# Patient Record
Sex: Male | Born: 1965 | Hispanic: No | Marital: Married | Smoking: Never smoker
Health system: Western US, Academic
[De-identification: ages and names within clinical notes are randomized; demographics above are authoritative.]

## PROBLEM LIST (undated history)

## (undated) HISTORY — PX: KNEE SURGERY: SHX244

---

## 1988-02-08 HISTORY — PX: PR ARTHROTOMY W/MENISCUS REPAIR KNEE: 27403

## 1988-02-08 HISTORY — PX: PR ARTHROTOMY,OPEN REPAIR MENISCUS: 27403

## 2013-02-07 HISTORY — PX: PR RELEASE THENAR MUSCLE: 26508

## 2013-02-07 HISTORY — PX: PR RELEASE THUMB CONTRACTURE: 26508

## 2014-03-10 HISTORY — PX: OTHER SURGICAL HISTORY: SHX169

## 2016-06-22 NOTE — Progress Notes (Signed)
Cardiology Office Note  Date:  06/24/2016   ID:  Joseph Bradley, DOB 06/12/1965, MRN [id number]  PCP:  Patient, No Pcp Per   Chief Complaint  Patient presents with  . other    self referral. Patient c/o SOB. Patient stated heart "doesnt feel right". Meds reviewed verbally with patient.     HPI:  51 year old male with strong family history of coronary artery disease, borderline hypertension, who presents by self-referral for cardiac evaluation of chest pain  He reports having rare episodes of chest discomfort, typically presenting at rest No symptoms with exertion Very active at baseline, [place], active with his horses   Reports having previous episode of chest pain several years ago  CT coronary calcium score for chest tightness, Reports that his score was 0  Done in [facility] , 2 to 3 years  Blood pressure mildly elevated on today's visit that he is in a very stressful job, 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  PSH:    Past Surgical History:  Procedure Laterality Date  . KNEE SURGERY      Current Outpatient Prescriptions  Medication Sig Dispense Refill  . ibuprofen (ADVIL,MOTRIN) 100 MG chewable tablet Chew by mouth daily as needed.      No current facility-administered medications for this visit.      Allergies:   Patient has no allergy information on record.   Social History:  The patient  reports that he has never smoked. His smokeless tobacco use includes Chew. He reports that he drinks alcohol. He reports that he does not use drugs.   Family History:   family history is not on file.    Review of Systems: Review of Systems  Constitutional:  Negative.   Respiratory: Negative.   Cardiovascular: Negative.   Gastrointestinal: Negative.   Musculoskeletal: Negative.   Neurological: Negative.   Psychiatric/Behavioral: Negative.   All other systems reviewed and are negative.    PHYSICAL EXAM: VS:  BP (!) 138/92 (BP Location: Right Arm, Patient Position: Sitting, Cuff Size: Normal)   Pulse 91   Ht 5\' 11"  (1.803 m)   Wt 248 lb 12 oz (112.8 kg)   BMI 34.69 kg/m  , BMI Body mass index is 34.69 kg/m. GEN: Well nourished, well developed, in no acute distress  HEENT: normal  Neck: no JVD, carotid bruits, or masses Cardiac: RRR; no murmurs, rubs, or gallops,no edema  Respiratory:  clear to auscultation bilaterally, normal work of breathing GI: soft, nontender, nondistended, + BS MS: no deformity or atrophy  Skin: warm and dry, no rash Neuro:  Strength and sensation are intact Psych: euthymic mood, full affect    Recent Labs: No results found for requested labs within last 8760 hours.    Lipid Panel No results found for: CHOL, HDL, LDLCALC, TRIG    Wt Readings from Last 3 Encounters:  06/24/16 248 lb 12 oz (112.8 kg)       ASSESSMENT AND PLAN:  Chest pain, unspecified type - Plan: EKG 12-Lead, Hepatic function panel, Lipid Profile, Hemoglobin A1c, Basic Metabolic Panel (BMET) We have requested his CT coronary calcium score from [place] done 2 or 3 years ago Atypical  symptoms, likely will not need any further workup Lab work ordered including lipid panel  Encounter for preventive care Nonsmoker, no diabetes Cholesterol panel pending Active at baseline He does not have primary care. Recommended he try to schedule visit with local physician  Borderline blood pressure Long discussion with him concerning his blood pressure Suggested he apply a blood pressure cuff As a nondiabetic, goal systolic pressure less than 140 diastolic less than 90 Suggested he call our office if blood pressure runs routinely above  this goal Recommended weight loss, low salt diet  Disposition:   F/U  as needed   Total encounter time more than 45 minutes  Greater than 50% was spent in counseling and coordination of care with the patient    Orders Placed This Encounter  Procedures  . Hepatic function panel  . Lipid Profile  . Hemoglobin A1c  . Basic Metabolic Panel (BMET)  . EKG 12-Lead     Signed, Dossie Arbourim Fraya Ueda, M.D., Ph.D. 06/24/2016  [facility], [place] [phone]

## 2016-06-24 ENCOUNTER — Encounter: Payer: Self-pay | Admitting: Cardiovascular Disease

## 2016-06-24 ENCOUNTER — Ambulatory Visit (INDEPENDENT_AMBULATORY_CARE_PROVIDER_SITE_OTHER): Payer: Commercial Managed Care - PPO | Admitting: Cardiovascular Disease

## 2016-06-24 VITALS — BP 138/92 | HR 91 | Ht 71.0 in | Wt 248.8 lb

## 2016-06-24 DIAGNOSIS — R030 Elevated blood-pressure reading, without diagnosis of hypertension: Secondary | ICD-10-CM | POA: Insufficient documentation

## 2016-06-24 DIAGNOSIS — Z0000 Encounter for general adult medical examination without abnormal findings: Secondary | ICD-10-CM

## 2016-06-24 DIAGNOSIS — R079 Chest pain, unspecified: Secondary | ICD-10-CM | POA: Insufficient documentation

## 2016-06-24 NOTE — Patient Instructions (Addendum)
We will [facility] records (in particular CT scan chest ) [facility]  Medication Instructions:   No medication changes made  Labwork:  We will check liver, lipids, HBA1C, BMP First week of June, fasting  Testing/Procedures:  No further testing at this time   I recommend watching educational videos on topics of interest to you at:       [email or web address]  Enter code: HEARTCARE    Follow-Up: It was a pleasure seeing you in the office today. Please call us if you have new issues that need to be addressed before your next appt.  [phone]  Your physician wants you to follow-up in: as needed  If you need a refill on your cardiac medications before your next appointment, please call your pharmacy.

## 2016-07-12 ENCOUNTER — Other Ambulatory Visit (INDEPENDENT_AMBULATORY_CARE_PROVIDER_SITE_OTHER): Payer: Commercial Managed Care - PPO

## 2016-07-12 DIAGNOSIS — Z0000 Encounter for general adult medical examination without abnormal findings: Secondary | ICD-10-CM

## 2016-07-12 DIAGNOSIS — R030 Elevated blood-pressure reading, without diagnosis of hypertension: Secondary | ICD-10-CM

## 2016-07-12 DIAGNOSIS — R079 Chest pain, unspecified: Secondary | ICD-10-CM

## 2016-07-13 LAB — SPECIMEN STATUS

## 2016-07-13 LAB — HEPATIC FUNCTION PANEL
ALK PHOS: 56 IU/L (ref 39–117)
ALT: 20 IU/L (ref 0–44)
AST: 21 IU/L (ref 0–40)
Albumin: 4.3 g/dL (ref 3.5–5.5)
Bilirubin Total: 0.3 mg/dL (ref 0.0–1.2)
Bilirubin, Direct: 0.08 mg/dL (ref 0.00–0.40)
Total Protein: 6.7 g/dL (ref 6.0–8.5)

## 2016-07-13 LAB — BASIC METABOLIC PANEL
BUN / CREAT RATIO: 17 (ref 9–20)
BUN: 14 mg/dL (ref 6–24)
CALCIUM: 8.9 mg/dL (ref 8.7–10.2)
CO2: 21 mmol/L (ref 18–29)
CREATININE: 0.82 mg/dL (ref 0.76–1.27)
Chloride: 106 mmol/L (ref 96–106)
GFR, EST AFRICAN AMERICAN: 117 mL/min/{1.73_m2} (ref >59)
GFR, EST NON AFRICAN AMERICAN: 101 mL/min/{1.73_m2} (ref >59)
Glucose: 82 mg/dL (ref 65–99)
Potassium: 4.5 mmol/L (ref 3.5–5.2)
Sodium: 143 mmol/L (ref 134–144)

## 2016-07-13 LAB — LIPID PANEL
CHOLESTEROL TOTAL: 153 mg/dL (ref 100–199)
Chol/HDL Ratio: 4.5 ratio (ref 0.0–5.0)
HDL: 34 mg/dL — AB (ref >39)
LDL Calculated: 91 mg/dL (ref 0–99)
TRIGLYCERIDES: 141 mg/dL (ref 0–149)
VLDL Cholesterol Cal: 28 mg/dL (ref 5–40)

## 2016-07-13 LAB — HEMOGLOBIN A1C
Est. average glucose Bld gHb Est-mCnc: 100 mg/dL
Hgb A1c MFr Bld: 5.1 % (ref 4.8–5.6)

## 2017-06-23 ENCOUNTER — Encounter: Payer: Self-pay | Admitting: Nurse Practitioner

## 2017-06-23 ENCOUNTER — Ambulatory Visit (INDEPENDENT_AMBULATORY_CARE_PROVIDER_SITE_OTHER): Payer: Commercial Managed Care - PPO | Admitting: Nurse Practitioner

## 2017-06-23 ENCOUNTER — Ambulatory Visit (INDEPENDENT_AMBULATORY_CARE_PROVIDER_SITE_OTHER): Payer: Commercial Managed Care - PPO

## 2017-06-23 VITALS — BP 146/90 | HR 97 | Temp 98.3°F | Ht 71.0 in | Wt 256.0 lb

## 2017-06-23 DIAGNOSIS — G8929 Other chronic pain: Secondary | ICD-10-CM

## 2017-06-23 DIAGNOSIS — R202 Paresthesia of skin: Secondary | ICD-10-CM

## 2017-06-23 DIAGNOSIS — Z1211 Encounter for screening for malignant neoplasm of colon: Secondary | ICD-10-CM | POA: Diagnosis not present

## 2017-06-23 DIAGNOSIS — M25511 Pain in right shoulder: Secondary | ICD-10-CM

## 2017-06-23 DIAGNOSIS — Z716 Tobacco abuse counseling: Secondary | ICD-10-CM | POA: Diagnosis not present

## 2017-06-23 DIAGNOSIS — Z720 Tobacco use: Secondary | ICD-10-CM

## 2017-06-23 DIAGNOSIS — R200 Anesthesia of skin: Secondary | ICD-10-CM

## 2017-06-23 DIAGNOSIS — M546 Pain in thoracic spine: Secondary | ICD-10-CM

## 2017-06-23 DIAGNOSIS — M47812 Spondylosis without myelopathy or radiculopathy, cervical region: Secondary | ICD-10-CM | POA: Diagnosis not present

## 2017-06-23 MED ORDER — BUPROPION HCL ER (SR) 150 MG PO TB12: 150.0000 mg | ORAL_TABLET | Freq: Two times a day (BID) | ORAL | 2 refills | Status: DC

## 2017-06-23 NOTE — Patient Instructions (Addendum)
Order cologuard. You will be contacted to verify information before kit is mailed.  Pick quit date, start wellbutrin 1week prior to quit date.  You will be contacted to schedule appt with ortho.  Bupropion sustained-release tablets (smoking cessation) What is this medicine? BUPROPION (byoo PROE pee on) is used to help people quit smoking. This medicine may be used for other purposes; ask your health care provider or pharmacist if you have questions. COMMON BRAND NAME(S): Buproban, Zyban What should I tell my health care provider before I take this medicine? They need to know if you have any of these conditions: -an eating disorder, such as anorexia or bulimia -bipolar disorder or psychosis -diabetes or high blood sugar, treated with medication -glaucoma -head injury or brain tumor -heart disease, previous heart attack, or irregular heart beat -high blood pressure -kidney or liver disease -seizures -suicidal thoughts or a previous suicide attempt -Tourette's syndrome -weight loss -an unusual or allergic reaction to bupropion, other medicines, foods, dyes, or preservatives -breast-feeding -pregnant or trying to become pregnant How should I use this medicine? Take this medicine by mouth with a glass of water. Follow the directions on the prescription label. You can take it with or without food. If it upsets your stomach, take it with food. Do not cut, crush or chew this medicine. Take your medicine at regular intervals. If you take this medicine more than once a day, take your second dose at least 8 hours after you take your first dose. To limit difficulty in sleeping, avoid taking this medicine at bedtime. Do not take your medicine more often than directed. Do not stop taking this medicine suddenly except upon the advice of your doctor. Stopping this medicine too quickly may cause serious side effects. A special MedGuide will be given to you by the pharmacist with each prescription and  refill. Be sure to read this information carefully each time. Talk to your pediatrician regarding the use of this medicine in children. Special care may be needed. Overdosage: If you think you have taken too much of this medicine contact a poison control center or emergency room at once. NOTE: This medicine is only for you. Do not share this medicine with others. What if I miss a dose? If you miss a dose, skip the missed dose and take your next tablet at the regular time. There should be at least 8 hours between doses. Do not take double or extra doses. What may interact with this medicine? Do not take this medicine with any of the following medications: -linezolid -MAOIs like Azilect, Carbex, Eldepryl, Marplan, Nardil, and Parnate -methylene blue (injected into a vein) -other medicines that contain bupropion like Wellbutrin This medicine may also interact with the following medications: -alcohol -certain medicines for anxiety or sleep -certain medicines for blood pressure like metoprolol, propranolol -certain medicines for depression or psychotic disturbances -certain medicines for HIV or AIDS like efavirenz, lopinavir, nelfinavir, ritonavir -certain medicines for irregular heart beat like propafenone, flecainide -certain medicines for Parkinson's disease like amantadine, levodopa -certain medicines for seizures like carbamazepine, phenytoin, phenobarbital -cimetidine -clopidogrel -cyclophosphamide -digoxin -furazolidone -isoniazid -nicotine -orphenadrine -procarbazine -steroid medicines like prednisone or cortisone -stimulant medicines for attention disorders, weight loss, or to stay awake -tamoxifen -theophylline -thiotepa -ticlopidine -tramadol -warfarin This list may not describe all possible interactions. Give your health care provider a list of all the medicines, herbs, non-prescription drugs, or dietary supplements you use. Also tell them if you smoke, drink alcohol, or  use illegal drugs. Some items  may interact with your medicine. What should I watch for while using this medicine? Visit your doctor or health care professional for regular checks on your progress. This medicine should be used together with a patient support program. It is important to participate in a behavioral program, counseling, or other support program that is recommended by your health care professional. Patients and their families should watch out for new or worsening thoughts of suicide or depression. Also watch out for sudden changes in feelings such as feeling anxious, agitated, panicky, irritable, hostile, aggressive, impulsive, severely restless, overly excited and hyperactive, or not being able to sleep. If this happens, especially at the beginning of treatment or after a change in dose, call your health care professional. Avoid alcoholic drinks while taking this medicine. Drinking excessive alcoholic beverages, using sleeping or anxiety medicines, or quickly stopping the use of these agents while taking this medicine may increase your risk for a seizure. Do not drive or use heavy machinery until you know how this medicine affects you. This medicine can impair your ability to perform these tasks. Do not take this medicine close to bedtime. It may prevent you from sleeping. Your mouth may get dry. Chewing sugarless gum or sucking hard candy, and drinking plenty of water may help. Contact your doctor if the problem does not go away or is severe. Do not use nicotine patches or chewing gum without the advice of your doctor or health care professional while taking this medicine. You may need to have your blood pressure taken regularly if your doctor recommends that you use both nicotine and this medicine together. What side effects may I notice from receiving this medicine? Side effects that you should report to your doctor or health care professional as soon as possible: -allergic reactions like  skin rash, itching or hives, swelling of the face, lips, or tongue -breathing problems -changes in vision -confusion -elevated mood, decreased need for sleep, racing thoughts, impulsive behavior -fast or irregular heartbeat -hallucinations, loss of contact with reality -increased blood pressure -redness, blistering, peeling or loosening of the skin, including inside the mouth -seizures -suicidal thoughts or other mood changes -unusually weak or tired -vomiting Side effects that usually do not require medical attention (report to your doctor or health care professional if they continue or are bothersome): -constipation -headache -loss of appetite -nausea -tremors -weight loss This list may not describe all possible side effects. Call your doctor for medical advice about side effects. You may report side effects to FDA at 1-800-FDA-1088. Where should I keep my medicine? Keep out of the reach of children. Store at room temperature between 20 and 25 degrees C (68 and 77 degrees F). Protect from light. Keep container tightly closed. Throw away any unused medicine after the expiration date. NOTE: This sheet is a summary. It may not cover all possible information. If you have questions about this medicine, talk to your doctor, pharmacist, or health care provider.  2018 Elsevier/Gold Standard (2015-07-17 13:49:28)

## 2017-06-23 NOTE — Progress Notes (Signed)
Subjective:  Patient ID: Joseph Bradley, male    DOB: [birth year]  Age: 52 y.o. MRN: [id number]  CC: Establish Care (est care/CPE-not fasting? quit chewing tobacco consult)   Shoulder Pain   The pain is present in the right shoulder. This is a chronic problem. The current episode started more than 1 year ago. There has been a history of trauma. The problem occurs constantly. The problem has been gradually worsening. The quality of the pain is described as aching and dull. Associated symptoms include joint locking, numbness, stiffness and tingling. Pertinent negatives include no fever, inability to bear weight, itching or joint swelling. The symptoms are aggravated by activity. He has tried NSAIDS for the symptoms. The treatment provided mild relief. His past medical history is significant for osteoarthritis.  Back Pain  This is a chronic problem. The current episode started more than 1 year ago. The problem occurs intermittently. The problem is unchanged. The pain is present in the thoracic spine. The quality of the pain is described as aching. Radiates to: bilateral hands. The symptoms are aggravated by bending, twisting and position. Stiffness is present all day. Associated symptoms include numbness, paresthesias and tingling. Pertinent negatives include no abdominal pain, bladder incontinence, bowel incontinence, chest pain, dysuria, fever, leg pain, paresis, weakness or weight loss. Risk factors include poor posture. He has tried NSAIDs for the symptoms.  Nicotine Dependence  Presents for initial visit. Symptoms include cravings and irritability. Symptoms are negative for decreased concentration, fatigue, headache, insomnia and sore throat. Preferred tobacco type: smokeless tobacco. His urge triggers include company of smokers, drinking coffee and stress. Risk factors do not include contact with substance, decrease in perceived risk, disruptive behavior or drinking alcohol.His first smoke is from 6  to 8 AM. Number of cigarettes per day: 1can per day. He started smoking when he was 39-41 years old. Past treatments include nothing. Joseph Bradley is ready to quit. Joseph Bradley has tried to quit 1 time. There is no history of alcohol abuse and drug use.   Outpatient Medications Prior to Visit  Medication Sig Dispense Refill  . ibuprofen (ADVIL,MOTRIN) 100 MG chewable tablet Chew by mouth daily as needed. Twice daily     No facility-administered medications prior to visit.    ROS See HPI  Objective:  BP (!) 146/90   Pulse 97   Temp 98.3 F (36.8 C) (Oral)   Ht  (1.803 m)   Wt 256 lb (116.1 kg)   SpO2 99%   BMI 35.70 kg/m   BP Readings from Last 3 Encounters:  06/23/17 (!) 146/90  06/24/16 (!) 138/92    Wt Readings from Last 3 Encounters:  06/23/17 256 lb (116.1 kg)  06/24/16 248 lb 12 oz (112.8 kg)    Physical Exam  Constitutional: He is oriented to person, place, and time. No distress.  Cardiovascular: Normal rate.  Pulmonary/Chest: Effort normal.  Musculoskeletal: He exhibits no edema, tenderness or deformity.       Right shoulder: He exhibits decreased range of motion. He exhibits no tenderness, no bony tenderness, no swelling, no effusion, no deformity, no pain, normal pulse and normal strength.       Left shoulder: Normal.       Right elbow: Normal.      Right wrist: Normal.       Cervical back: Normal.       Thoracic back: Normal.       Right upper arm: Normal.       Left upper arm:  Normal.       Right forearm: Normal.       Right hand: Normal.  Neurological: He is alert and oriented to person, place, and time.  Skin: Skin is warm and dry.  Vitals reviewed.   Lab Results  Component Value Date   WBC WILL FOLLOW 07/12/2016   HGB WILL FOLLOW 07/12/2016   HCT WILL FOLLOW 07/12/2016   PLT WILL FOLLOW 07/12/2016   GLUCOSE 82 07/12/2016   CHOL 153 07/12/2016   TRIG 141 07/12/2016   HDL 34 (L) 07/12/2016   LDLCALC 91 07/12/2016   ALT 20 07/12/2016   AST 21  07/12/2016   NA 143 07/12/2016   K 4.5 07/12/2016   CL 106 07/12/2016   CREATININE 0.82 07/12/2016   BUN 14 07/12/2016   CO2 21 07/12/2016   HGBA1C 5.1 07/12/2016    Patient was never admitted.  Assessment & Plan:   Torri was seen today for establish care.  Diagnoses and all orders for this visit:  Chronic right shoulder pain -     AMB referral to orthopedics  Colon cancer screening  Chronic thoracic spine pain -     DG Thoracic Spine W/Swimmers; Future -     DG Cervical Spine Complete; Future -     DG Cervical Spine Complete -     DG Thoracic Spine W/Swimmers  Encounter for tobacco use cessation counseling -     buPROPion (WELLBUTRIN SR) 150 MG 12 hr tablet; Take 1 tablet (150 mg total) by mouth 2 (two) times daily.  Smokeless tobacco use -     buPROPion (WELLBUTRIN SR) 150 MG 12 hr tablet; Take 1 tablet (150 mg total) by mouth 2 (two) times daily.  Numbness and tingling in both hands -     DG Thoracic Spine W/Swimmers; Future -     DG Cervical Spine Complete; Future -     DG Cervical Spine Complete -     DG Thoracic Spine W/Swimmers   I am having Joseph Bradley start on buPROPion. I am also having him maintain his ibuprofen.  Meds ordered this encounter  Medications  . buPROPion (WELLBUTRIN SR) 150 MG 12 hr tablet    Sig: Take 1 tablet (150 mg total) by mouth 2 (two) times daily.    Dispense:  60 tablet    Refill:  2    Order Specific Question:   Supervising Provider    Answer:   Dianne Dun [[id number]]    Follow-up: Return in about 2 months (around 08/23/2017) for tobacco cessation.  Alysia Penna, NP

## 2017-06-25 ENCOUNTER — Encounter: Payer: Self-pay | Admitting: Nurse Practitioner

## 2017-06-25 DIAGNOSIS — R200 Anesthesia of skin: Secondary | ICD-10-CM | POA: Insufficient documentation

## 2017-06-25 DIAGNOSIS — M25511 Pain in right shoulder: Principal | ICD-10-CM

## 2017-06-25 DIAGNOSIS — R202 Paresthesia of skin: Secondary | ICD-10-CM

## 2017-06-25 DIAGNOSIS — Z720 Tobacco use: Secondary | ICD-10-CM | POA: Insufficient documentation

## 2017-06-25 DIAGNOSIS — G8929 Other chronic pain: Secondary | ICD-10-CM | POA: Insufficient documentation

## 2017-06-25 DIAGNOSIS — M546 Pain in thoracic spine: Secondary | ICD-10-CM

## 2017-06-26 ENCOUNTER — Telehealth: Payer: Self-pay | Admitting: Nurse Practitioner

## 2017-06-26 NOTE — Telephone Encounter (Signed)
Cologuard ordered. Order [id number] has been submitted.  Waiting for result.

## 2017-07-04 DIAGNOSIS — Z1211 Encounter for screening for malignant neoplasm of colon: Secondary | ICD-10-CM | POA: Diagnosis not present

## 2017-07-04 DIAGNOSIS — Z1212 Encounter for screening for malignant neoplasm of rectum: Secondary | ICD-10-CM | POA: Diagnosis not present

## 2017-07-04 LAB — COLOGUARD: Cologuard: NEGATIVE

## 2017-07-10 ENCOUNTER — Encounter (INDEPENDENT_AMBULATORY_CARE_PROVIDER_SITE_OTHER): Payer: Self-pay | Admitting: Orthopedic Surgery

## 2017-07-10 ENCOUNTER — Ambulatory Visit (INDEPENDENT_AMBULATORY_CARE_PROVIDER_SITE_OTHER): Payer: Self-pay

## 2017-07-10 ENCOUNTER — Ambulatory Visit (INDEPENDENT_AMBULATORY_CARE_PROVIDER_SITE_OTHER): Payer: Commercial Managed Care - PPO | Admitting: Orthopedic Surgery

## 2017-07-10 DIAGNOSIS — G8929 Other chronic pain: Secondary | ICD-10-CM | POA: Diagnosis not present

## 2017-07-10 DIAGNOSIS — M25511 Pain in right shoulder: Secondary | ICD-10-CM

## 2017-07-11 ENCOUNTER — Encounter (INDEPENDENT_AMBULATORY_CARE_PROVIDER_SITE_OTHER): Payer: Self-pay | Admitting: Orthopedic Surgery

## 2017-07-11 NOTE — Progress Notes (Signed)
Office Visit Note   Patient: Joseph BrookeMarc Schoonmaker           Date of Birth: 08/21/1965           MRN: [id number] Visit Date: 07/10/2017 Requested by: Anne NgNche, Charlotte Lum, NP [address] [place], [place] [address] PCP: Anne NgNche, Charlotte Lum, NP  Subjective: Chief Complaint  Patient presents with  . Right Shoulder - Pain    HPI: Joseph LericheMark is a patient with right shoulder pain as well as bilateral hand numbness and tingling and lower cervical spine upper thoracic spine pain.  He is to be very active in high school playing football hockey and other sports.  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  He showed me a video of this event.  He said symptoms in his shoulder and neck and hands for about 5 years.  Left shoulder has no problem.  He basically comes up in terms of not really being able to throw a tennis ball across the yard where he used to be able to throw a football many yards.  He also describes numbness and tingling in digits 3 and 4 in both hands left worse than right.  This numbing and tingling is distinctly dependent on neck position with either flexion or extension making it worse.  He denies any distinct weakness in the arms and denies any leg symptoms such as weakness or loss of coordination.              ROS: All systems reviewed are negative as they relate to the chief complaint within the history of present illness.  Patient denies  fevers or chills.   Assessment & Plan: Visit Diagnoses:  1. Chronic right shoulder pain     Plan: Impression is right shoulder pain which looks like it could be bursitis but may more likely be superior labral tear.  His rotator cuff strength is intact and there is no loss of passive range of motion.  I do not detect any coarseness on exam.  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  He reports some mechanical symptoms in the right shoulder with circumduction type motion.  MRI arthrogram of the right shoulder is  indicated based on failure of conservative treatment relatively frequent symptoms and duration of symptoms.  In regards to the neck and hands he does have dorsal and palmar paresthesias.  These are dependent on neck position.  Radiographs of cervical spine and thoracic spine on the system are reviewed and they are pretty unremarkable.  He is having a lot of symptoms on the upper portion of his thoracic spine.  I think that would be something that could be symptomatic but he may also have a component of spinal stenosis or disc problem.  I would favor MRI cervical spine to evaluate bilateral hand numbness and tingling.  Carpal tunnel less likely.  Negative Tinel's in the cubital tunnel bilaterally.  Follow-Up Instructions: Return for after MRI.   Orders:  Orders Placed This Encounter  Procedures  . XR Shoulder Right   No orders of the defined types were placed in this encounter.     Procedures: No procedures performed   Clinical Data: No additional findings.  Objective: Vital Signs: There were no vitals taken for this visit.  Physical Exam:   Constitutional: Patient appears well-developed HEENT:  Head: Normocephalic Eyes:EOM are normal Neck: Normal range of motion Cardiovascular: Normal rate Pulmonary/chest: Effort normal Neurologic: Patient is  alert Skin: Skin is warm Psychiatric: Patient has normal mood and affect    Ortho Exam: Orthopedic exam demonstrates full active and passive range of motion of bilateral wrists elbows and shoulders.  On the right shoulder he has positive O'Brien's testing on the right negative on the left.  No discrete AC joint tenderness on the right left-hand side asymmetrically.  He has excellent rotator cuff strength infraspinatus supraspinatus and subscap muscle testing.  No masses lymphadenopathy or skin changes noted in the neck region.  Does have reproduction of hand numbness and tingling with flexion and extension of the neck.  Reflexes symmetric  bilateral biceps triceps and patella.  1+ out of 4.  Radial pulse intact bilaterally.  No distinct dermatomal paresthesias above the wrist.  No restriction of passive range of motion of the shoulder on either side.  Negative Tinel's cubital tunnel and no subluxation of the ulnar nerve in either elbow.  No muscle wasting in either arm.  Specialty Comments:  No specialty comments available.  Imaging: No results found.   PMFS History: Patient Active Problem List   Diagnosis Date Noted  . Chronic right shoulder pain 06/25/2017  . Chronic thoracic spine pain 06/25/2017  . Smokeless tobacco use 06/25/2017  . Numbness and tingling in both hands 06/25/2017  . Chest pain 06/24/2016  . Encounter for preventive care 06/24/2016  . Borderline blood pressure 06/24/2016   History reviewed. No pertinent past medical history.  Family History  Problem Relation Age of Onset  . Heart disease Mother   . Diabetes Father   . Heart disease Father   . Arrhythmia Brother     Past Surgical History:  Procedure Laterality Date  . KNEE SURGERY    . thumb sur Right 03/2014   Social History   Occupational History  . Not on file  Tobacco Use  . Smoking status: Never Smoker  . Smokeless tobacco: Former Neurosurgeon    Types: Chew  Substance and Sexual Activity  . Alcohol use: Yes    Comment: 1-2 beer a month  . Drug use: No  . Sexual activity: Not on file

## 2017-07-12 ENCOUNTER — Other Ambulatory Visit (INDEPENDENT_AMBULATORY_CARE_PROVIDER_SITE_OTHER): Payer: Self-pay | Admitting: Radiology

## 2017-07-12 DIAGNOSIS — M542 Cervicalgia: Secondary | ICD-10-CM

## 2017-07-12 DIAGNOSIS — M25512 Pain in left shoulder: Secondary | ICD-10-CM

## 2017-07-26 ENCOUNTER — Encounter: Payer: Self-pay | Admitting: Nurse Practitioner

## 2017-07-26 NOTE — Progress Notes (Signed)
Abstracted result and sent to scan  

## 2017-08-11 ENCOUNTER — Inpatient Hospital Stay
Admission: RE | Admit: 2017-08-11 | Discharge: 2017-08-11 | Disposition: A | Discharge status: Home / Self Care | Payer: Commercial Managed Care - PPO | Source: Ambulatory Visit | Attending: Orthopedic Surgery | Admitting: Orthopedic Surgery

## 2017-08-11 ENCOUNTER — Inpatient Hospital Stay: Admission: RE | Admit: 2017-08-11 | Payer: Commercial Managed Care - PPO | Source: Ambulatory Visit

## 2017-08-11 ENCOUNTER — Other Ambulatory Visit: Payer: Commercial Managed Care - PPO

## 2017-08-25 ENCOUNTER — Encounter: Payer: Self-pay | Admitting: Nurse Practitioner

## 2017-08-29 ENCOUNTER — Other Ambulatory Visit: Payer: Commercial Managed Care - PPO

## 2017-09-25 ENCOUNTER — Encounter: Payer: Self-pay | Admitting: Nurse Practitioner

## 2017-09-26 ENCOUNTER — Encounter: Payer: Self-pay | Admitting: Nurse Practitioner

## 2017-09-26 ENCOUNTER — Ambulatory Visit: Payer: Commercial Managed Care - PPO | Admitting: Nurse Practitioner

## 2017-09-26 VITALS — BP 146/94 | HR 93 | Temp 98.4°F | Ht 71.0 in | Wt 251.0 lb

## 2017-09-26 DIAGNOSIS — L739 Follicular disorder, unspecified: Secondary | ICD-10-CM

## 2017-09-26 MED ORDER — MUPIROCIN 2 % EX OINT: 1.0000 "application " | TOPICAL_OINTMENT | Freq: Two times a day (BID) | CUTANEOUS | 0 refills | Status: AC

## 2017-09-26 NOTE — Progress Notes (Signed)
Subjective:  Patient ID: Joseph Bradley, male    DOB: 11/02/1965  Age: 52 y.o. MRN: [id number]  CC: Rash (rash,itchy on arms. this is has been going for 1 wks. took benadryl and hydrocotison cream. )  Rash  This is a new problem. The current episode started in the past 7 days. The problem is unchanged. The affected locations include the left arm. The rash is characterized by itchiness, redness and burning. It is unknown if there was an exposure to a precipitant. Pertinent negatives include no fatigue, fever or joint pain. Past treatments include antihistamine. The treatment provided mild relief.   Reviewed past Medical, Social and Family history today.  Outpatient Medications Prior to Visit  Medication Sig Dispense Refill  . ibuprofen (ADVIL,MOTRIN) 100 MG chewable tablet Chew by mouth daily as needed. Twice daily    . naproxen sodium (ALEVE) 220 MG tablet Take 220 mg by mouth.    Marland Kitchen. buPROPion (WELLBUTRIN SR) 150 MG 12 hr tablet Take 1 tablet (150 mg total) by mouth 2 (two) times daily. (Patient not taking: Reported on 09/26/2017) 60 tablet 2   No facility-administered medications prior to visit.     ROS See HPI  Objective:  BP (!) 146/94   Pulse 93   Temp 98.4 F (36.9 C) (Oral)   Ht 5\' 11"  (1.803 m)   Wt 251 lb (113.9 kg)   SpO2 97%   BMI 35.01 kg/m   BP Readings from Last 3 Encounters:  09/26/17 (!) 146/94  06/23/17 (!) 146/90  06/24/16 (!) 138/92    Wt Readings from Last 3 Encounters:  09/26/17 251 lb (113.9 kg)  06/23/17 256 lb (116.1 kg)  06/24/16 248 lb 12 oz (112.8 kg)    Physical Exam  Constitutional: He is oriented to person, place, and time.  Musculoskeletal: He exhibits no edema or tenderness.  Neurological: He is alert and oriented to person, place, and time.  Skin: Rash noted. Rash is pustular. There is erythema.     Vitals reviewed.   Lab Results  Component Value Date   WBC WILL FOLLOW 07/12/2016   HGB WILL FOLLOW 07/12/2016   HCT WILL FOLLOW  07/12/2016   PLT WILL FOLLOW 07/12/2016   GLUCOSE 82 07/12/2016   CHOL 153 07/12/2016   TRIG 141 07/12/2016   HDL 34 (L) 07/12/2016   LDLCALC 91 07/12/2016   ALT 20 07/12/2016   AST 21 07/12/2016   NA 143 07/12/2016   K 4.5 07/12/2016   CL 106 07/12/2016   CREATININE 0.82 07/12/2016   BUN 14 07/12/2016   CO2 21 07/12/2016   HGBA1C 5.1 07/12/2016    Assessment & Plan:   Joseph Bradley was seen today for rash.  Diagnoses and all orders for this visit:  Folliculitis -     mupirocin ointment (BACTROBAN) 2 %; Place 1 application into the nose 2 (two) times daily for 8 days.   I am having Joseph Bradley start on mupirocin ointment. I am also having him maintain his ibuprofen, buPROPion, and naproxen sodium.  Meds ordered this encounter  Medications  . mupirocin ointment (BACTROBAN) 2 %    Sig: Place 1 application into the nose 2 (two) times daily for 8 days.    Dispense:  15 g    Refill:  0    Order Specific Question:   Supervising Provider    Answer:   Dianne DunARON, TALIA M [[id number]]    Follow-up: Return if symptoms worsen or fail to improve.  Alysia Pennaharlotte Nche, NP

## 2017-09-26 NOTE — Patient Instructions (Signed)
Send mychart message if no improvement in 5days. Will send oral abx if no improvement with topical abx ointment.  his condition may be treated by:  Applying warm compresses to the affected areas.  Taking an antibiotic medicine or applying an antibiotic medicine to the skin.  Applying or bathing with an antiseptic solution.  Taking an over-the-counter medicine to help with itching.  Folliculitis Folliculitis is inflammation of the hair follicles. Folliculitis most commonly occurs on the scalp, thighs, legs, back, and buttocks. However, it can occur anywhere on the body. What are the causes? This condition may be caused by:  A bacterial infection (common).  A fungal infection.  A viral infection.  Coming into contact with certain chemicals, especially oils and tars.  Shaving or waxing.  Applying greasy ointments or creams to your skin often.  Long-lasting folliculitis and folliculitis that keeps coming back can be caused by bacteria that live in the nostrils. What increases the risk? This condition is more likely to develop in people with:  A weakened immune system.  Diabetes.  Obesity.  What are the signs or symptoms? Symptoms of this condition include:  Redness.  Soreness.  Swelling.  Itching.  Small white or yellow, pus-filled, itchy spots (pustules) that appear over a reddened area. If there is an infection that goes deep into the follicle, these may develop into a boil (furuncle).  A group of closely packed boils (carbuncle). These tend to form in hairy, sweaty areas of the body.  How is this diagnosed? This condition is diagnosed with a skin exam. To find what is causing the condition, your health care provider may take a sample of one of the pustules or boils for testing. How is this treated? This condition may be treated by:  Applying warm compresses to the affected areas.  Taking an antibiotic medicine or applying an antibiotic medicine to the  skin.  Applying or bathing with an antiseptic solution.  Taking an over-the-counter medicine to help with itching.  Having a procedure to drain any pustules or boils. This may be done if a pustule or boil contains a lot of pus or fluid.  Laser hair removal. This may be done to treat long-lasting folliculitis.  Follow these instructions at home:  If directed, apply heat to the affected area as often as told by your health care provider. Use the heat source that your health care provider recommends, such as a moist heat pack or a heating pad. ? Place a towel between your skin and the heat source. ? Leave the heat on for 20-30 minutes. ? Remove the heat if your skin turns bright red. This is especially important if you are unable to feel pain, heat, or cold. You may have a greater risk of getting burned.  If you were prescribed an antibiotic medicine, use it as told by your health care provider. Do not stop using the antibiotic even if you start to feel better.  Take over-the-counter and prescription medicines only as told by your health care provider.  Do not shave irritated skin.  Keep all follow-up visits as told by your health care provider. This is important. Get help right away if:  You have more redness, swelling, or pain in the affected area.  Red streaks are spreading from the affected area.  You have a fever. This information is not intended to replace advice given to you by your health care provider. Make sure you discuss any questions you have with your health care provider.  Document Released: 04/04/2001 Document Revised: 08/14/2015 Document Reviewed: 11/14/2014 Elsevier Interactive Patient Education  2018 [employer].

## 2017-10-20 ENCOUNTER — Other Ambulatory Visit: Payer: Self-pay | Admitting: Nurse Practitioner

## 2017-10-20 DIAGNOSIS — Z720 Tobacco use: Secondary | ICD-10-CM

## 2017-10-20 DIAGNOSIS — Z716 Tobacco abuse counseling: Secondary | ICD-10-CM

## 2017-11-07 ENCOUNTER — Ambulatory Visit: Payer: Commercial Managed Care - PPO

## 2017-11-08 ENCOUNTER — Ambulatory Visit (INDEPENDENT_AMBULATORY_CARE_PROVIDER_SITE_OTHER): Payer: Commercial Managed Care - PPO

## 2017-11-08 DIAGNOSIS — Z23 Encounter for immunization: Secondary | ICD-10-CM | POA: Diagnosis not present

## 2017-11-08 NOTE — Progress Notes (Signed)
Patient came into the office for his flu vaccine. Vaccine given in the left deltoid & patient tolerated the injection well. No signs/symptoms of a reaction prior to patient leaving the exam room. VIS sheet given to patient.

## 2017-11-29 ENCOUNTER — Encounter: Payer: Self-pay | Admitting: Nurse Practitioner

## 2017-11-29 ENCOUNTER — Ambulatory Visit: Payer: Self-pay | Admitting: *Deleted

## 2017-11-29 ENCOUNTER — Ambulatory Visit: Payer: Commercial Managed Care - PPO | Admitting: Nurse Practitioner

## 2017-11-29 VITALS — BP 142/96 | HR 96 | Temp 98.1°F | Ht 71.0 in | Wt 249.0 lb

## 2017-11-29 DIAGNOSIS — R42 Dizziness and giddiness: Secondary | ICD-10-CM

## 2017-11-29 DIAGNOSIS — R030 Elevated blood-pressure reading, without diagnosis of hypertension: Secondary | ICD-10-CM | POA: Diagnosis not present

## 2017-11-29 DIAGNOSIS — H6122 Impacted cerumen, left ear: Secondary | ICD-10-CM | POA: Diagnosis not present

## 2017-11-29 LAB — CBC WITH DIFFERENTIAL/PLATELET
BASOS PCT: 0.4 % (ref 0.0–3.0)
Basophils Absolute: 0 10*3/uL (ref 0.0–0.1)
Eosinophils Absolute: 0.2 10*3/uL (ref 0.0–0.7)
Eosinophils Relative: 1.7 % (ref 0.0–5.0)
HCT: 46.7 % (ref 39.0–52.0)
HEMOGLOBIN: 16.2 g/dL (ref 13.0–17.0)
LYMPHS ABS: 2.5 10*3/uL (ref 0.7–4.0)
Lymphocytes Relative: 29 % (ref 12.0–46.0)
MCHC: 34.7 g/dL (ref 30.0–36.0)
MCV: 85.4 fl (ref 78.0–100.0)
MONO ABS: 0.6 10*3/uL (ref 0.1–1.0)
Monocytes Relative: 6.9 % (ref 3.0–12.0)
Neutro Abs: 5.4 10*3/uL (ref 1.4–7.7)
Neutrophils Relative %: 62 % (ref 43.0–77.0)
Platelets: 265 10*3/uL (ref 150.0–400.0)
RBC: 5.46 Mil/uL (ref 4.22–5.81)
RDW: 12.7 % (ref 11.5–15.5)
WBC: 8.7 10*3/uL (ref 4.0–10.5)

## 2017-11-29 LAB — COMPREHENSIVE METABOLIC PANEL
ALT: 46 U/L (ref 0–53)
AST: 25 U/L (ref 0–37)
Albumin: 4.6 g/dL (ref 3.5–5.2)
Alkaline Phosphatase: 58 U/L (ref 39–117)
BUN: 17 mg/dL (ref 6–23)
CALCIUM: 9.5 mg/dL (ref 8.4–10.5)
CO2: 26 meq/L (ref 19–32)
CREATININE: 0.89 mg/dL (ref 0.40–1.50)
Chloride: 104 mEq/L (ref 96–112)
GFR: 94.42 mL/min (ref >60.00)
Glucose, Bld: 83 mg/dL (ref 70–99)
Potassium: 4.3 mEq/L (ref 3.5–5.1)
Sodium: 139 mEq/L (ref 135–145)
TOTAL PROTEIN: 7.5 g/dL (ref 6.0–8.3)
Total Bilirubin: 0.4 mg/dL (ref 0.2–1.2)

## 2017-11-29 LAB — TSH: TSH: 1.34 u[IU]/mL (ref 0.35–4.50)

## 2017-11-29 MED ORDER — MECLIZINE HCL 25 MG PO TABS: 25.0000 mg | ORAL_TABLET | Freq: Three times a day (TID) | ORAL | 1 refills | Status: AC | PRN

## 2017-11-29 NOTE — Progress Notes (Signed)
Subjective:  Patient ID: Joseph Bradley, male    DOB: January 11, 1966  Age: 52 y.o. MRN: [id number]  CC: Dizziness (patient is complaining of dizzy/ started 2 days ago. )  Dizziness  This is a new problem. The current episode started in the past 7 days. The problem occurs intermittently. The problem has been unchanged. Associated symptoms include vertigo. Pertinent negatives include no anorexia, chest pain, chills, congestion, coughing, diaphoresis, fatigue, fever, headaches, nausea, neck pain, numbness, rash, swollen glands, visual change or weakness. Associated symptoms comments: Left ear fullness and bilateral tinnitus. The symptoms are aggravated by twisting and standing. He has tried rest for the symptoms. The treatment provided moderate relief.   Onset on Saturday, woke up dizzy, unsteady feeling.  Reviewed past Medical, Social and Family history today.  Outpatient Medications Prior to Visit  Medication Sig Dispense Refill  . ibuprofen (ADVIL,MOTRIN) 100 MG chewable tablet Chew by mouth daily as needed. Twice daily    . naproxen sodium (ALEVE) 220 MG tablet Take 220 mg by mouth.    Marland Kitchen buPROPion (WELLBUTRIN SR) 150 MG 12 hr tablet TAKE 1 TABLET BY MOUTH TWICE A DAY (Patient not taking: Reported on 11/29/2017) 60 tablet 2   No facility-administered medications prior to visit.     ROS See HPI  Procedure Note :     Procedure :  Ear irrigation (left)   Indication:  Cerumen impaction (left)   Risks, including pain, dizziness, eardrum perforation, bleeding, infection and others as well as benefits were explained to the patient in detail. Verbal consent was obtained and the patient agreed to proceed.    We used "The Elephant Ear Irrigation Device" filled with lukewarm water for irrigation. A large amount wax was recovered. Procedure has also required manual wax removal with an ear loop.   Tolerated well. Complications: None.   Postprocedure instructions :  Call if  problems.  Objective:  BP (!) 142/96   Pulse 96   Temp 98.1 F (36.7 C) (Oral)   Ht 5\' 11"  (1.803 m)   Wt 249 lb (112.9 kg)   SpO2 97%   BMI 34.73 kg/m   BP Readings from Last 3 Encounters:  11/29/17 (!) 142/96  09/26/17 (!) 146/94  06/23/17 (!) 146/90    Wt Readings from Last 3 Encounters:  11/29/17 249 lb (112.9 kg)  09/26/17 251 lb (113.9 kg)  06/23/17 256 lb (116.1 kg)    Physical Exam  Constitutional: He is oriented to person, place, and time. He appears well-developed and well-nourished.  Neck: Normal range of motion. Neck supple. No thyromegaly present.  Cardiovascular: Normal rate, regular rhythm, normal heart sounds and intact distal pulses.  Pulmonary/Chest: Effort normal and breath sounds normal.  Musculoskeletal: He exhibits no edema.  Lymphadenopathy:    He has no cervical adenopathy.  Neurological: He is alert and oriented to person, place, and time. No cranial nerve deficit. Coordination normal.  Skin: Skin is warm and dry.  Vitals reviewed.   Lab Results  Component Value Date   WBC WILL FOLLOW 07/12/2016   HGB WILL FOLLOW 07/12/2016   HCT WILL FOLLOW 07/12/2016   PLT WILL FOLLOW 07/12/2016   GLUCOSE 82 07/12/2016   CHOL 153 07/12/2016   TRIG 141 07/12/2016   HDL 34 (L) 07/12/2016   LDLCALC 91 07/12/2016   ALT 20 07/12/2016   AST 21 07/12/2016   NA 143 07/12/2016   K 4.5 07/12/2016   CL 106 07/12/2016   CREATININE 0.82 07/12/2016   BUN 14  07/12/2016   CO2 21 07/12/2016   HGBA1C 5.1 07/12/2016    Assessment & Plan:   Tyray was seen today for dizziness.  Diagnoses and all orders for this visit:  Vertigo -     CBC w/Diff -     TSH -     Comprehensive metabolic panel -     meclizine (ANTIVERT) 25 MG tablet; Take 1 tablet (25 mg total) by mouth 3 (three) times daily as needed for dizziness.  Elevated BP without diagnosis of hypertension -     CBC w/Diff -     TSH -     Comprehensive metabolic panel  Impacted cerumen of left  ear   I am having Earley Brooke start on meclizine. I am also having him maintain his ibuprofen, naproxen sodium, and buPROPion.  Meds ordered this encounter  Medications  . meclizine (ANTIVERT) 25 MG tablet    Sig: Take 1 tablet (25 mg total) by mouth 3 (three) times daily as needed for dizziness.    Dispense:  9 tablet    Refill:  1    Order Specific Question:   Supervising Provider    Answer:   Dianne Dun [[id number]]    Follow-up: Return if symptoms worsen or fail to improve.  Alysia Penna, NP

## 2017-11-29 NOTE — Patient Instructions (Addendum)
Go to lab for blood draw.  He declined BP medication. States he will make an effort in changing his diet.  Benign Positional Vertigo Vertigo is the feeling that you or your surroundings are moving when they are not. Benign positional vertigo is the most common form of vertigo. The cause of this condition is not serious (is benign). This condition is triggered by certain movements and positions (is positional). This condition can be dangerous if it occurs while you are doing something that could endanger you or others, such as driving. What are the causes? In many cases, the cause of this condition is not known. It may be caused by a disturbance in an area of the inner ear that helps your brain to sense movement and balance. This disturbance can be caused by a viral infection (labyrinthitis), head injury, or repetitive motion. What increases the risk? This condition is more likely to develop in:  Women.  People who are 55 years of age or older.  What are the signs or symptoms? Symptoms of this condition usually happen when you move your head or your eyes in different directions. Symptoms may start suddenly, and they usually last for less than a minute. Symptoms may include:  Loss of balance and falling.  Feeling like you are spinning or moving.  Feeling like your surroundings are spinning or moving.  Nausea and vomiting.  Blurred vision.  Dizziness.  Involuntary eye movement (nystagmus).  Symptoms can be mild and cause only slight annoyance, or they can be severe and interfere with daily life. Episodes of benign positional vertigo may return (recur) over time, and they may be triggered by certain movements. Symptoms may improve over time. How is this diagnosed? This condition is usually diagnosed by medical history and a physical exam of the head, neck, and ears. You may be referred to a health care provider who specializes in ear, nose, and throat (ENT) problems (otolaryngologist)  or a provider who specializes in disorders of the nervous system (neurologist). You may have additional testing, including:  MRI.  A CT scan.  Eye movement tests. Your health care provider may ask you to change positions quickly while he or she watches you for symptoms of benign positional vertigo, such as nystagmus. Eye movement may be tested with an electronystagmogram (ENG), caloric stimulation, the Dix-Hallpike test, or the roll test.  An electroencephalogram (EEG). This records electrical activity in your brain.  Hearing tests.  How is this treated? Usually, your health care provider will treat this by moving your head in specific positions to adjust your inner ear back to normal. Surgery may be needed in severe cases, but this is rare. In some cases, benign positional vertigo may resolve on its own in 2-4 weeks. Follow these instructions at home: Safety  Move slowly.Avoid sudden body or head movements.  Avoid driving.  Avoid operating heavy machinery.  Avoid doing any tasks that would be dangerous to you or others if a vertigo episode would occur.  If you have trouble walking or keeping your balance, try using a cane for stability. If you feel dizzy or unstable, sit down right away.  Return to your normal activities as told by your health care provider. Ask your health care provider what activities are safe for you. General instructions  Take over-the-counter and prescription medicines only as told by your health care provider.  Avoid certain positions or movements as told by your health care provider.  Drink enough fluid to keep your urine clear  or pale yellow.  Keep all follow-up visits as told by your health care provider. This is important. Contact a health care provider if:  You have a fever.  Your condition gets worse or you develop new symptoms.  Your family or friends notice any behavioral changes.  Your nausea or vomiting gets worse.  You have numbness or  a "pins and needles" sensation. Get help right away if:  You have difficulty speaking or moving.  You are always dizzy.  You faint.  You develop severe headaches.  You have weakness in your legs or arms.  You have changes in your hearing or vision.  You develop a stiff neck.  You develop sensitivity to light. This information is not intended to replace advice given to you by your health care provider. Make sure you discuss any questions you have with your health care provider. Document Released: 11/01/2005 Document Revised: 07/02/2015 Document Reviewed: 05/19/2014 Elsevier Interactive Patient Education  2018 [employer].   DASH Eating Plan DASH stands for "Dietary Approaches to Stop Hypertension." The DASH eating plan is a healthy eating plan that has been shown to reduce high blood pressure (hypertension). It may also reduce your risk for type 2 diabetes, heart disease, and stroke. The DASH eating plan may also help with weight loss. What are tips for following this plan? General guidelines  Avoid eating more than 2,300 mg (milligrams) of salt (sodium) a day. If you have hypertension, you may need to reduce your sodium intake to 1,500 mg a day.  Limit alcohol intake to no more than 1 drink a day for nonpregnant women and 2 drinks a day for men. One drink equals 12 oz of beer, 5 oz of wine, or 1 oz of hard liquor.  Work with your health care provider to maintain a healthy body weight or to lose weight. Ask what an ideal weight is for you.  Get at least 30 minutes of exercise that causes your heart to beat faster (aerobic exercise) most days of the week. Activities may include walking, swimming, or biking.  Work with your health care provider or diet and nutrition specialist (dietitian) to adjust your eating plan to your individual calorie needs. Reading food labels  Check food labels for the amount of sodium per serving. Choose foods with less than 5 percent of the Daily  Value of sodium. Generally, foods with less than 300 mg of sodium per serving fit into this eating plan.  To find whole grains, look for the word "whole" as the first word in the ingredient list. Shopping  Buy products labeled as "low-sodium" or "no salt added."  Buy fresh foods. Avoid canned foods and premade or frozen meals. Cooking  Avoid adding salt when cooking. Use salt-free seasonings or herbs instead of table salt or sea salt. Check with your health care provider or pharmacist before using salt substitutes.  Do not fry foods. Cook foods using healthy methods such as baking, boiling, grilling, and broiling instead.  Cook with heart-healthy oils, such as olive, canola, soybean, or sunflower oil. Meal planning   Eat a balanced diet that includes: ? 5 or more servings of fruits and vegetables each day. At each meal, try to fill half of your plate with fruits and vegetables. ? Up to 6-8 servings of whole grains each day. ? Less than 6 oz of lean meat, poultry, or fish each day. A 3-oz serving of meat is about the same size as a deck of cards. One egg  equals 1 oz. ? 2 servings of low-fat dairy each day. ? A serving of nuts, seeds, or beans 5 times each week. ? Heart-healthy fats. Healthy fats called Omega-3 fatty acids are found in foods such as flaxseeds and coldwater fish, like sardines, salmon, and mackerel.  Limit how much you eat of the following: ? Canned or prepackaged foods. ? Food that is high in trans fat, such as fried foods. ? Food that is high in saturated fat, such as fatty meat. ? Sweets, desserts, sugary drinks, and other foods with added sugar. ? Full-fat dairy products.  Do not salt foods before eating.  Try to eat at least 2 vegetarian meals each week.  Eat more home-cooked food and less restaurant, buffet, and fast food.  When eating at a restaurant, ask that your food be prepared with less salt or no salt, if possible. What foods are recommended? The  items listed may not be a complete list. Talk with your dietitian about what dietary choices are best for you. Grains Whole-grain or whole-wheat bread. Whole-grain or whole-wheat pasta. Brown rice. Orpah Cobb. Bulgur. Whole-grain and low-sodium cereals. Pita bread. Low-fat, low-sodium crackers. Whole-wheat flour tortillas. Vegetables Fresh or frozen vegetables (raw, steamed, roasted, or grilled). Low-sodium or reduced-sodium tomato and vegetable juice. Low-sodium or reduced-sodium tomato sauce and tomato paste. Low-sodium or reduced-sodium canned vegetables. Fruits All fresh, dried, or frozen fruit. Canned fruit in natural juice (without added sugar). Meat and other protein foods Skinless chicken or [place]. Ground chicken or [place]. Pork with fat trimmed off. Fish and seafood. Egg whites. Dried beans, peas, or lentils. Unsalted nuts, nut butters, and seeds. Unsalted canned beans. Lean cuts of beef with fat trimmed off. Low-sodium, lean deli meat. Dairy Low-fat (1%) or fat-free (skim) milk. Fat-free, low-fat, or reduced-fat cheeses. Nonfat, low-sodium ricotta or cottage cheese. Low-fat or nonfat yogurt. Low-fat, low-sodium cheese. Fats and oils Soft margarine without trans fats. Vegetable oil. Low-fat, reduced-fat, or light mayonnaise and salad dressings (reduced-sodium). Canola, safflower, olive, soybean, and sunflower oils. Avocado. Seasoning and other foods Herbs. Spices. Seasoning mixes without salt. Unsalted popcorn and pretzels. Fat-free sweets. What foods are not recommended? The items listed may not be a complete list. Talk with your dietitian about what dietary choices are best for you. Grains Baked goods made with fat, such as croissants, muffins, or some breads. Dry pasta or rice meal packs. Vegetables Creamed or fried vegetables. Vegetables in a cheese sauce. Regular canned vegetables (not low-sodium or reduced-sodium). Regular canned tomato sauce and paste (not low-sodium or  reduced-sodium). Regular tomato and vegetable juice (not low-sodium or reduced-sodium). Rosita Fire. Olives. Fruits Canned fruit in a light or heavy syrup. Fried fruit. Fruit in cream or butter sauce. Meat and other protein foods Fatty cuts of meat. Ribs. Fried meat. Tomasa Blase. Sausage. Bologna and other processed lunch meats. Salami. Fatback. Hotdogs. Bratwurst. Salted nuts and seeds. Canned beans with added salt. Canned or smoked fish. Whole eggs or egg yolks. Chicken or [place] with skin. Dairy Whole or 2% milk, cream, and half-and-half. Whole or full-fat cream cheese. Whole-fat or sweetened yogurt. Full-fat cheese. Nondairy creamers. Whipped toppings. Processed cheese and cheese spreads. Fats and oils Butter. Stick margarine. Lard. Shortening. Ghee. Bacon fat. Tropical oils, such as coconut, palm kernel, or palm oil. Seasoning and other foods Salted popcorn and pretzels. Onion salt, garlic salt, seasoned salt, table salt, and sea salt. Worcestershire sauce. Tartar sauce. Barbecue sauce. Teriyaki sauce. Soy sauce, including reduced-sodium. Steak sauce. Canned and packaged gravies. Fish sauce.  Oyster sauce. Cocktail sauce. Horseradish that you find on the shelf. Ketchup. Mustard. Meat flavorings and tenderizers. Bouillon cubes. Hot sauce and Tabasco sauce. Premade or packaged marinades. Premade or packaged taco seasonings. Relishes. Regular salad dressings. Where to find more information:  National Heart, Lung, and Blood Institute: PopSteam.is  American Heart Association: www.heart.org Summary  The DASH eating plan is a healthy eating plan that has been shown to reduce high blood pressure (hypertension). It may also reduce your risk for type 2 diabetes, heart disease, and stroke.  With the DASH eating plan, you should limit salt (sodium) intake to 2,300 mg a day. If you have hypertension, you may need to reduce your sodium intake to 1,500 mg a day.  When on the DASH eating plan, aim to eat more  fresh fruits and vegetables, whole grains, lean proteins, low-fat dairy, and heart-healthy fats.  Work with your health care provider or diet and nutrition specialist (dietitian) to adjust your eating plan to your individual calorie needs. This information is not intended to replace advice given to you by your health care provider. Make sure you discuss any questions you have with your health care provider. Document Released: 01/13/2011 Document Revised: 01/18/2016 Document Reviewed: 01/18/2016 Elsevier Interactive Patient Education  [employer].

## 2017-11-29 NOTE — Telephone Encounter (Signed)
  Reason for Disposition . [1] MILD dizziness (e.g., walking normally) AND [2] has NOT been evaluated by physician for this  (Exception: dizziness caused by heat exposure, sudden standing, or poor fluid intake)  Protocols used: DIZZINESS - LIGHTHEADEDNESS-A-AH  

## 2017-11-29 NOTE — Assessment & Plan Note (Signed)
Declined BP medication despite potential risk. FHx of HTN and CAD. He wants to try DASH diet only at this time.

## 2017-11-29 NOTE — Telephone Encounter (Signed)
Called from work, did not complete a full triage. Lightheaded since Saturday but no vertigo. Feeling somewhat better today and back to work.Appointment made for 9:45 this morning with C. Nche, NP Answer Assessment - Initial Assessment Questions 1. DESCRIPTION: "Describe your dizziness."     lightheaded 2. LIGHTHEADED: "Do you feel lightheaded?" (e.g., somewhat faint, woozy, weak upon standing)     woozy 3. VERTIGO: "Do you feel like either you or the room is spinning or tilting?" (i.e. vertigo)     no 4. SEVERITY: "How bad is it?"  "Do you feel like you are going to faint?" "Can you stand and walk?"   - MILD - walking normally   - MODERATE - interferes with normal activities (e.g., work, school)    - SEVERE - unable to stand, requires support to walk, feels like passing out now.      Improved today. Stayed home from work the last 2 days but at work today. 5. ONSET:  "When did the dizziness begin?"     4 days ago 6. AGGRAVATING FACTORS: "Does anything make it worse?" (e.g., standing, change in head position)     no 7. HEART RATE: "Can you tell me your heart rate?" "How many beats in 15 seconds?"  (Note: not all patients can do this)       Did not assess 8. CAUSE: "What do you think is causing the dizziness?"     Ear-maybe 9. RECURRENT SYMPTOM: "Have you had dizziness before?" If so, ask: "When was the last time?" "What happened that time?"     no 10. OTHER SYMPTOMS: "Do you have any other symptoms?" (e.g., fever, chest pain, vomiting, diarrhea, bleeding)       Wife thinks he had fever yesterday. 11. PREGNANCY: "Is there any chance you are pregnant?" "When was your last menstrual period?"       na  Protocols used: DIZZINESS [facility]

## 2019-11-10 IMAGING — DX DG CERVICAL SPINE COMPLETE 4+V
5 series · 5 of 5 positions shown · non-contrast
Comparison: No prior.

CLINICAL DATA: Numbness.  Chronic back pain.

EXAM:
CERVICAL SPINE - COMPLETE 4+ VIEW

[cervical spine ap]
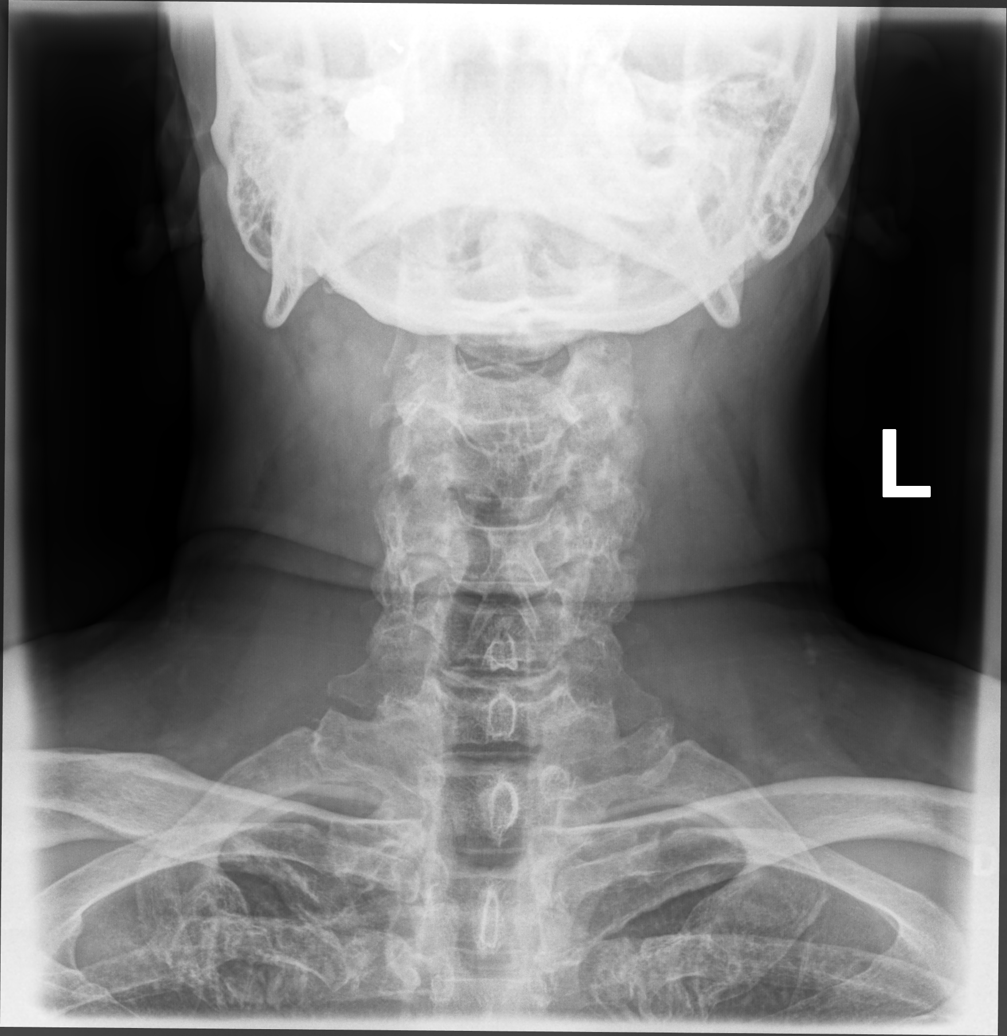

[cervical spine oblique (1 of 2)]
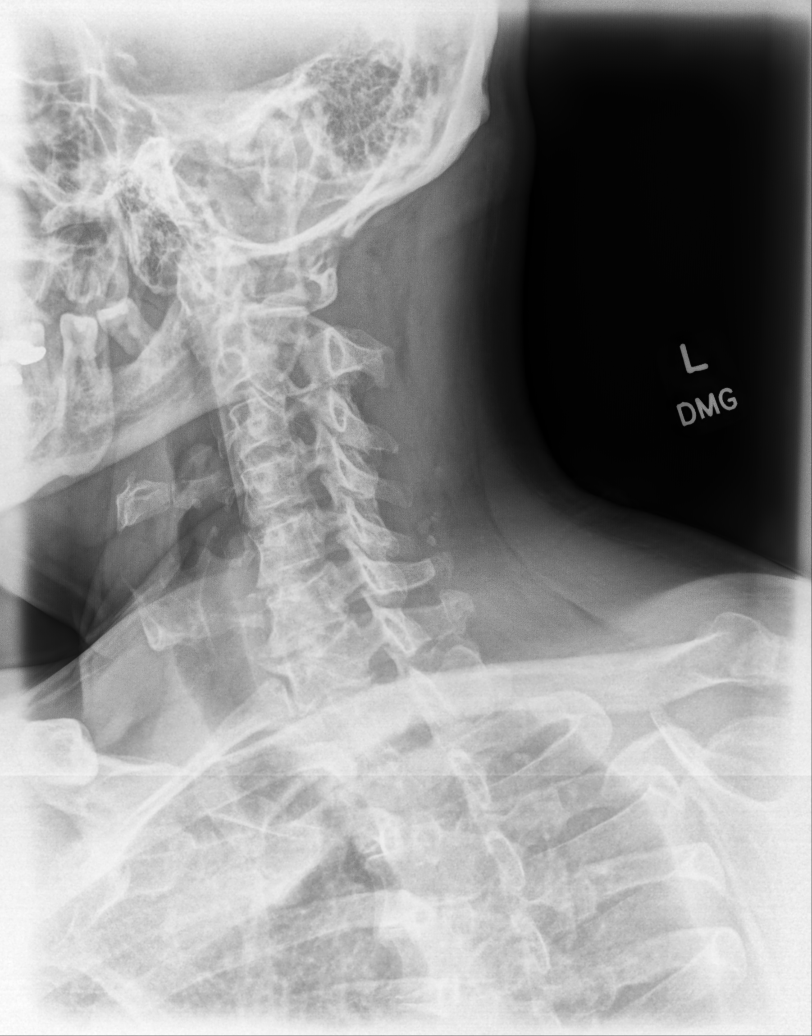

[cervical spine oblique (2 of 2)]
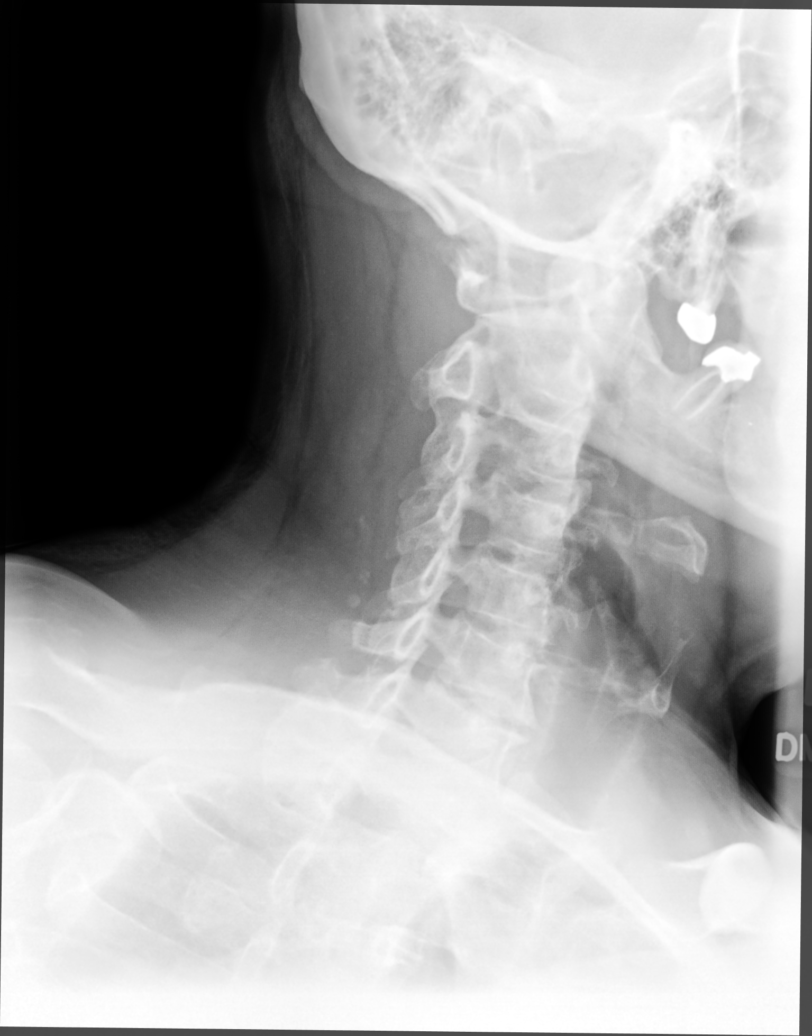

[cervical spine lat]
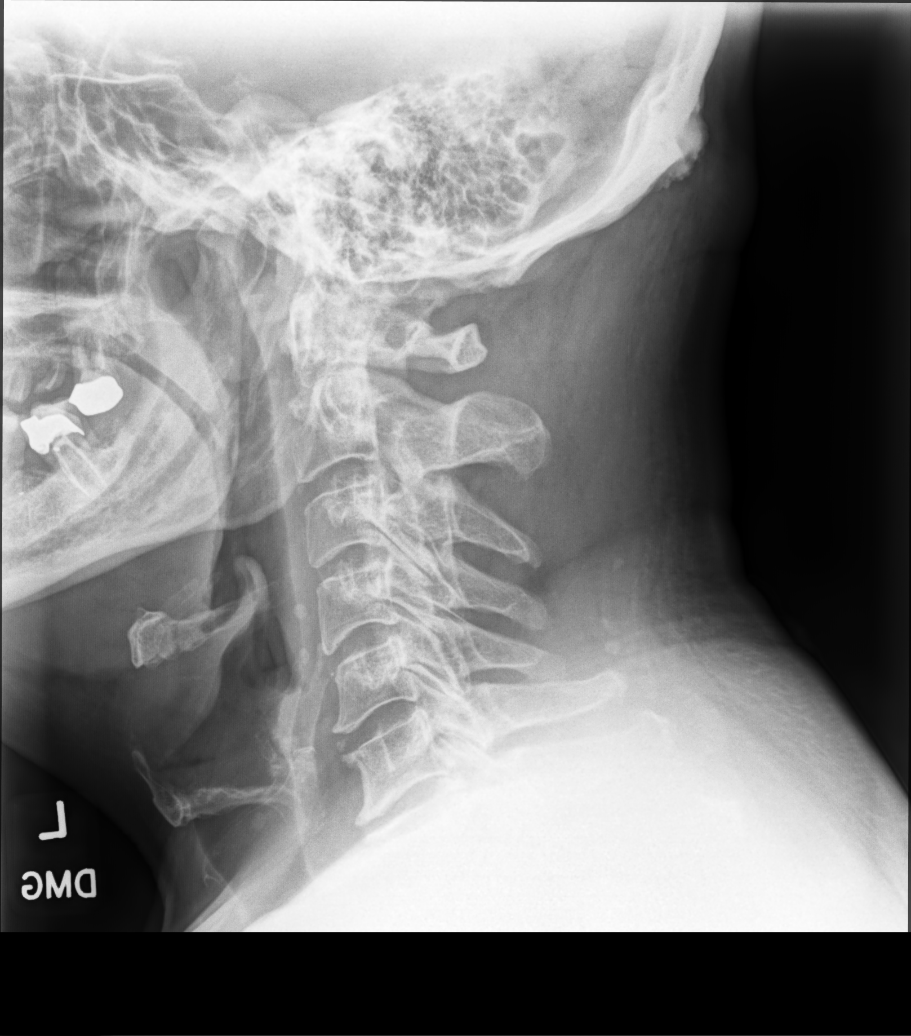

[cervical spine open mouth ap]
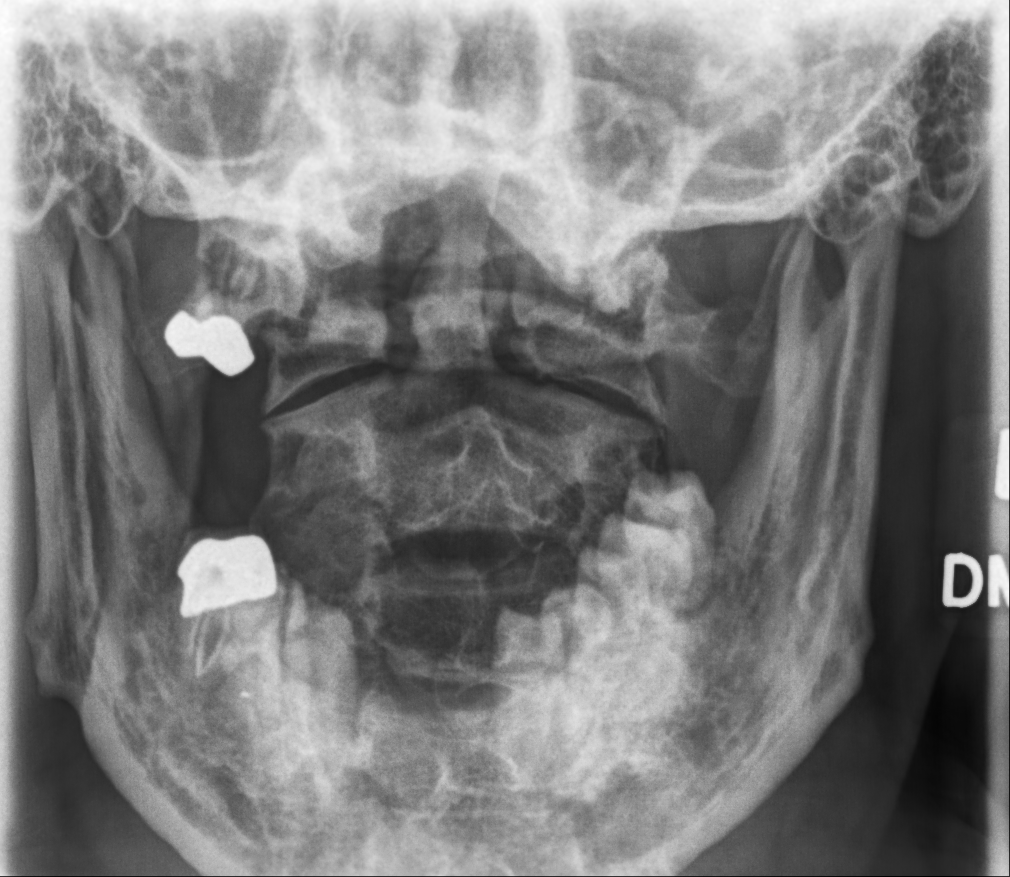

[5 of 5 positions shown; findings below may reference images not displayed]

FINDINGS: C7 incompletely imaged. Acute bony or joint abnormality identified.
No evidence of fracture dislocation. Diffuse degenerative change.
Neuroforamen are patent. Ligamentous ossification noted.
IMPRESSION: Diffuse degenerative change.  No acute abnormality.

## 2019-11-10 IMAGING — DX DG THORACIC SPINE 3V
3 series · 3 of 3 positions shown · non-contrast
Comparison: None.

CLINICAL DATA: Chronic thoracic spine pain.

EXAM:
THORACIC SPINE - 3 VIEWS

[thoracic spine ap]
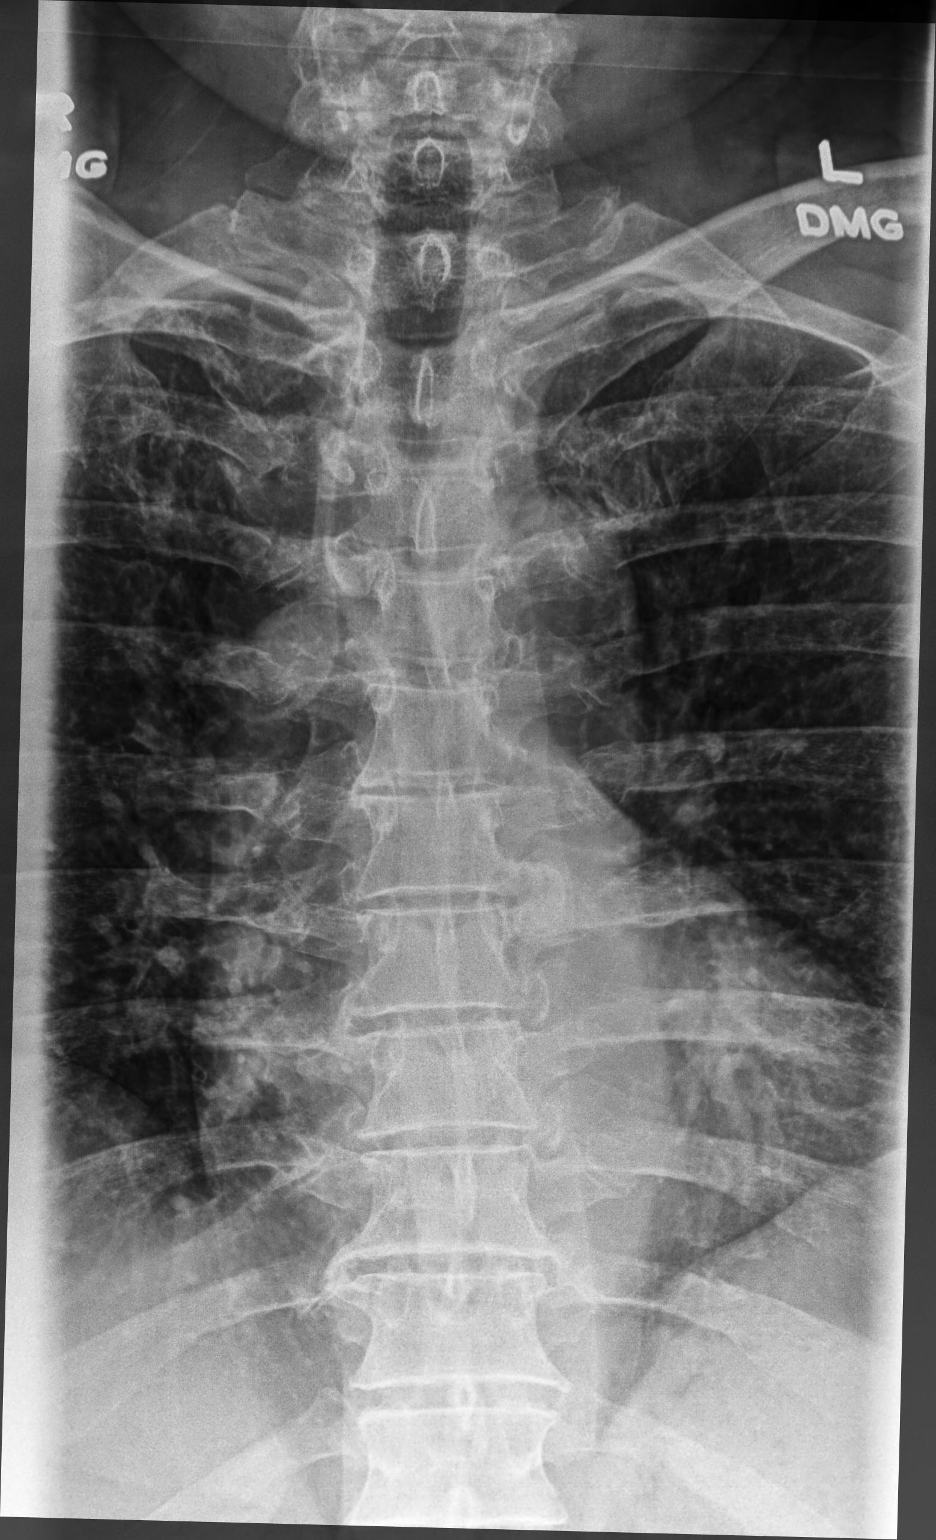

[thoracic spine lat]
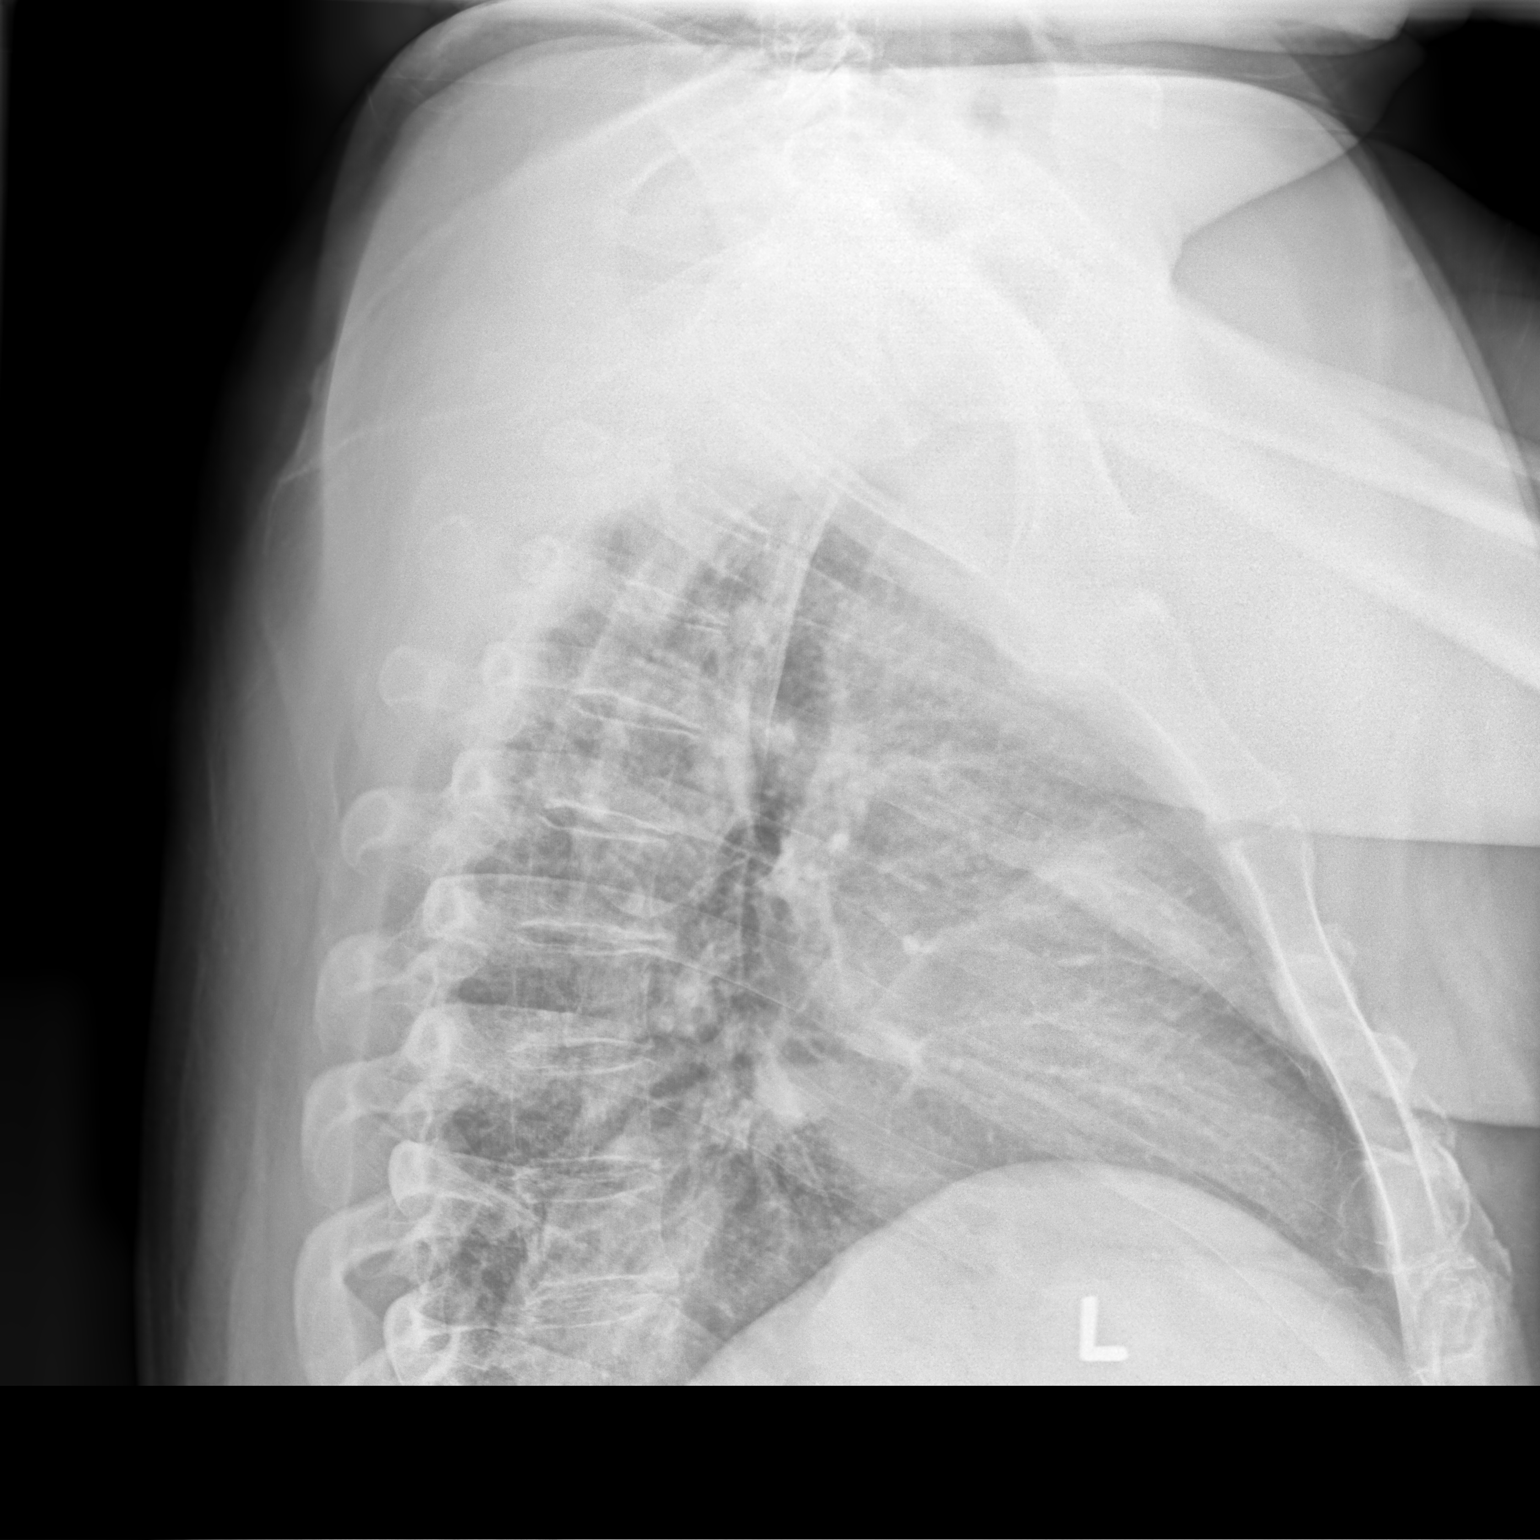

[cervico-thoracic (swimmers) lat]
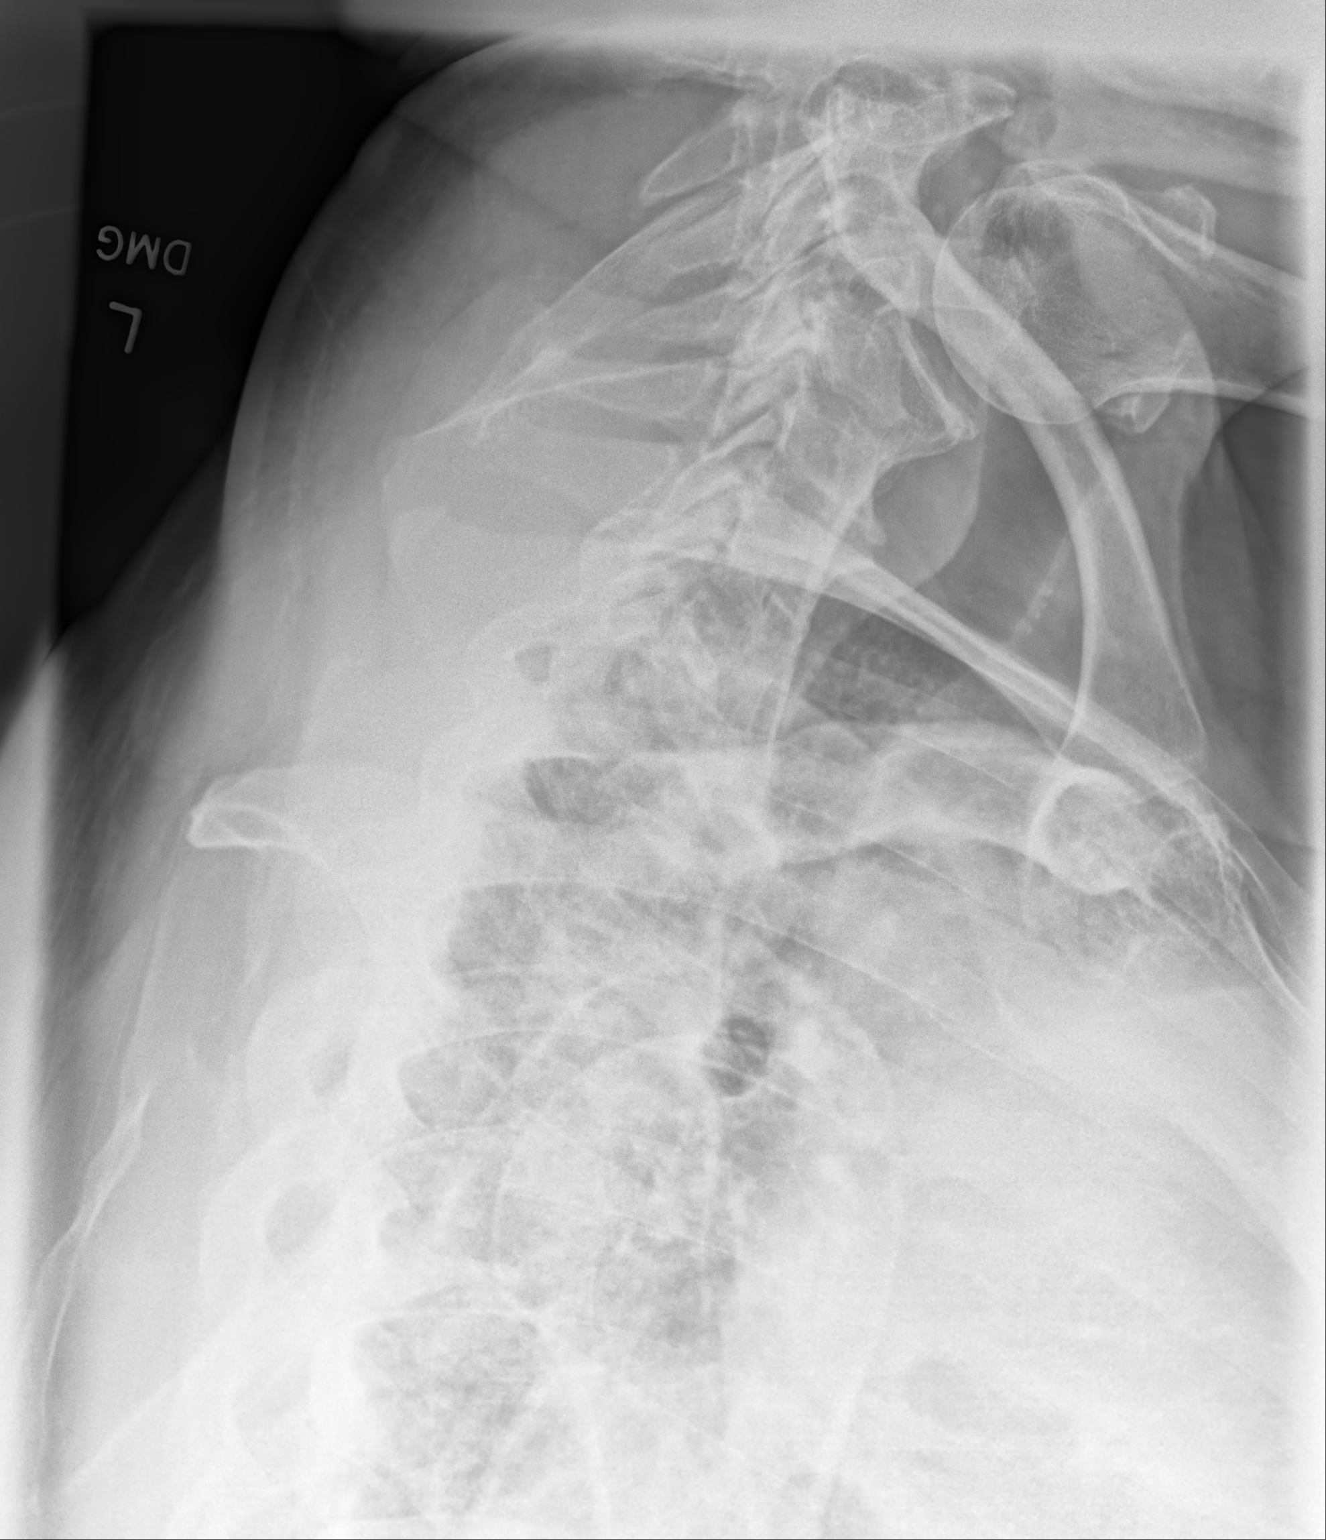

[3 of 3 positions shown; findings below may reference images not displayed]

FINDINGS: No fracture or spondylolisthesis is noted. Mild degenerative changes
seen involving lower thoracic disc space.
IMPRESSION: No acute abnormality seen in the thoracic spine.

## 2021-03-08 ENCOUNTER — Ambulatory Visit: Payer: Commercial Managed Care - HMO | Admitting: FAMILY PRACTICE

## 2021-04-01 ENCOUNTER — Encounter: Payer: Self-pay | Admitting: FAMILY PRACTICE

## 2021-08-05 NOTE — Progress Notes (Signed)
Family Medicine Clinic PGY-2 Note    Patient WAS wearing a surgical mask  PPE used by provider during encounter: Surgical mask   Date: 08/06/2021    CC:   Chief Complaint   Patient presents with    Physical    Bump     On heel    Hand Pain       HPI:    Kenneth Bauer is a 56yrold male who presents to clinic for the following concerns:    # Establish care  Last saw a PCP 8 years ago.  States BP high at that time.    # Elevated BP  Denies headaches, vision changes, and chest pain.  Has never been on BP meds.    # Bilateral hand pain  Difficulty making fists in the morning.  No issues with strength.  Doing all daily activities.  Taking naproxen 220 mg twice a day.  Taking tylenol 650 mg daily once a day.   3/10 pain currently that goes up to 8/10 in the AM.  No recent hand injuries.     # L foot heel bump  Started 2 months ago.  Pain with ambulation, up to 7/10 pain at it's worst.   Stretching relieves pain.     Answers submitted by the patient for this visit:  MUSCULOSKELETAL PAIN (Submitted on 08/05/2021)  Chief Complaint: MUSCULOSKELETAL PAIN  Degree of Activity Impairment?: limits athletic participation, moderately limits activities  Pain location: multiple joints, right ankle  sleep pattern normal: Yes  Pain Quality: aching  Context: unknown  Pain location: left hand, left knee, left shoulder, right ankle, right foot, right hand, right heel  Severity of pain: medium  Aggravated by: nothing  joint swelling: Yes  stiffness: Yes  Treatments tried: acetaminophen  Swelling: hands, joints      # HCM  Hgb A1C- dude  Lipid panel- due  CRC screen- due for repeat cologuard in 2024  Flu- n/a  COVID- due for bivalent     PMH  None    PSH  R thumb surgery- 2015  B/l meniscus tear repair-1990    MEDs  None    ALL  None    FH  Paternal grandfather- MI  Father- DM, CAD  Mother- emphysema from smoking  Brother- arrhythmia    SH  Lives in [place] with wife.  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   Never smoked but chews tobacco every day (states will not quit ever)  Drinks  1-2 drinks per month.  No rec drugs.  Sexually active with one partner, wife.     OBJECTIVE  Vitals  BP (!) 180/132 (SITE: left arm, Orthostatic Position: sitting, Cuff Size: large)   Pulse 105   Temp 36.8 C (98.3 F) (Skin)   Resp 20   Wt 116.2 kg (256 lb 3.2 oz)   SpO2 97%      Physical Exam  Patient is ambulatory, overall well appearing, participates in conversation easily.    General: Well appearing, pleasant, NAD  HEENT: moist mucus membranes  Heart: RRR. No m/r/g.   Lungs: No increased WOB. CTAB.  Abdomen: soft, non-distended, non-tender.  Neuro: A&O x3  MSK: 5/5 flexion, extension, and hand grip of BUE.   Skin: Circular, hard nodule over the R heel.   Psych: mood normal, behavior normal      Recent pertinent labs and imaging include: none    ASSESSMENT  and PLAN  Kenneth Bauer is a 56yrold male who presents for:    1. Encounter for medical examination to establish care  Last saw PCP 8 years ago.     2. Primary hypertension  States had elevated BP readings in the past and BP elevated to 180/132 today. Likely uncontrolled HTN. After discussion of several medications, patient amenable to starting amlodipine. Will also check BP at home and present for nurse BP check.  Tx  - BP log  - POC BLOOD PRESSURE CHECK NURSE ONLY; Future  - Amlodipine (NORVASC) 5 mg Tablet; Take 1 tablet by mouth every day.  Dispense: 90 tablet; Refill: 0    3. Bilateral hand pain  Issue for severald years. Described as difficulty using hands in the AM that resolves over time.  3/10 pain currently that can go up to 8/10. 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 Taking naproxen 220 mg twice a day and tylenol 650 mg daily once a day with moderate pain relief. Likely OA. Will increased doses of OTC meds as below and re-eval.   Tx  - Increases naproxen to 440 mg BID  -  Increase tylenol to 650 mg TID     4. Right foot pain  Small hard nodule over the heel of the foot causing pain with ambulation. Can consider bone spur vs cyst vs callous.   Dx  - FOOT 3+ VIEWS, RIGHT; Future    5. Health care maintenance  - Hemoglobin A1C; Future  - Comprehensive Metabolic Panel; Future  - CBC with Differential; Future  - Lipid Panel with DLDL Reflex; Future        Things to discuss at next visit:  HTN  Hand pain  Foot pain    Follow-up: Return in about 4 weeks (around 09/03/2021) for BP and hand pain.      Precepted by Dr. Gustavus Messing.    Duncan Dull, MD, MPH  PGY-2, Department of [facility] and [facility]

## 2021-08-06 ENCOUNTER — Encounter: Payer: Self-pay | Admitting: FAMILY PRACTICE

## 2021-08-06 ENCOUNTER — Ambulatory Visit (HOSPITAL_BASED_OUTPATIENT_CLINIC_OR_DEPARTMENT_OTHER): Payer: Commercial Managed Care - HMO | Admitting: FAMILY PRACTICE

## 2021-08-06 ENCOUNTER — Ambulatory Visit: Payer: Commercial Managed Care - HMO

## 2021-08-06 ENCOUNTER — Ambulatory Visit
Admission: RE | Admit: 2021-08-06 | Discharge: 2021-08-06 | Disposition: A | Discharge status: Home / Self Care | Payer: Commercial Managed Care - HMO | Source: Ambulatory Visit | Attending: FAMILY PRACTICE | Admitting: FAMILY PRACTICE

## 2021-08-06 VITALS — BP 180/132 | HR 105 | Temp 98.3°F | Resp 20 | Wt 256.2 lb

## 2021-08-06 DIAGNOSIS — R2241 Localized swelling, mass and lump, right lower limb: Secondary | ICD-10-CM

## 2021-08-06 DIAGNOSIS — Z0000 Encounter for general adult medical examination without abnormal findings: Secondary | ICD-10-CM

## 2021-08-06 DIAGNOSIS — I10 Essential (primary) hypertension: Secondary | ICD-10-CM | POA: Insufficient documentation

## 2021-08-06 DIAGNOSIS — M79642 Pain in left hand: Secondary | ICD-10-CM | POA: Insufficient documentation

## 2021-08-06 DIAGNOSIS — M79671 Pain in right foot: Secondary | ICD-10-CM | POA: Insufficient documentation

## 2021-08-06 DIAGNOSIS — M79641 Pain in right hand: Secondary | ICD-10-CM

## 2021-08-06 DIAGNOSIS — M7989 Other specified soft tissue disorders: Secondary | ICD-10-CM

## 2021-08-06 LAB — COMPREHENSIVE METABOLIC PANEL
Alanine Transferase (ALT): 30 U/L (ref <=41)
Albumin: 4.5 g/dL (ref 4.0–4.9)
Alkaline Phosphatase (ALP): 66 U/L (ref 35–129)
Anion Gap: 12 mmol/L (ref 7–15)
Aspartate Transaminase (AST): 24 U/L (ref <=41)
Bilirubin Total: 0.3 mg/dL (ref <=1.2)
Calcium: 9.1 mg/dL (ref 8.6–10.0)
Carbon Dioxide Total: 24 mmol/L (ref 22–29)
Chloride: 106 mmol/L (ref 98–107)
Creatinine Serum: 0.81 mg/dL (ref 0.51–1.17)
E-GFR Creatinine (Male): >100 mL/min/{1.73_m2}
Glucose: 75 mg/dL (ref 74–109)
Potassium: 3.9 mmol/L (ref 3.4–5.1)
Protein: 7.2 g/dL (ref 6.6–8.7)
Sodium: 142 mmol/L (ref 136–145)
Urea Nitrogen, Blood (BUN): 15 mg/dL (ref 6–20)

## 2021-08-06 LAB — CBC WITH DIFFERENTIAL
Basophils % Auto: 0.4 %
Basophils Abs Auto: 0 10*3/uL (ref 0.0–0.2)
Eosinophils % Auto: 2.1 %
Eosinophils Abs Auto: 0.2 10*3/uL (ref 0.0–0.5)
Hematocrit: 44.5 % (ref 41.0–53.0)
Hemoglobin: 15.3 g/dL (ref 13.5–17.5)
Lymphocytes % Auto: 25.2 %
Lymphocytes Abs Auto: 2.1 10*3/uL (ref 1.0–4.8)
MCH: 29.4 pg (ref 27.0–33.0)
MCHC: 34.4 % (ref 32.0–36.0)
MCV: 85.3 fL (ref 80.0–100.0)
MPV: 9.2 fL (ref 6.8–10.0)
Monocytes % Auto: 8.1 %
Monocytes Abs Auto: 0.7 10*3/uL (ref 0.1–0.8)
Neutrophils % Auto: 64.2 %
Neutrophils Abs Auto: 5.4 10*3/uL (ref 1.8–7.7)
Platelet Count: 271 10*3/uL (ref 130–400)
RDW: 12.9 % (ref 0.0–14.7)
Red Blood Cell Count: 5.22 10*6/uL (ref 4.50–5.90)
White Blood Cell Count: 8.5 10*3/uL (ref 4.5–11.0)

## 2021-08-06 LAB — LIPID PANEL WITH DLDL REFLEX
Cholesterol: 177 mg/dL (ref <200)
HDL Cholesterol: 36 mg/dL — ABNORMAL LOW (ref >40)
Non-HDL Cholesterol: 141 mg/dL (ref <=150)
Total Cholesterol: HDL Ratio: 4.9 — ABNORMAL HIGH (ref <4.0)
Triglyceride: 245 mg/dL — ABNORMAL HIGH (ref <150)

## 2021-08-06 LAB — HEMOGLOBIN A1C
Hgb A1C,Glucose Est Avg: 105 mg/dL
Hgb A1C: 5.3 % (ref 3.9–5.6)

## 2021-08-06 LAB — LDL CHOLESTEROL (DIRECT): LDL Cholesterol (Direct): 117 mg/dL — ABNORMAL HIGH (ref <100)

## 2021-08-06 MED ORDER — AMLODIPINE 5 MG TABLET
5.0000 mg | ORAL_TABLET | Freq: Every day | ORAL | 0 refills | Status: DC
Start: 2021-08-06 — End: 2021-08-25

## 2021-08-06 NOTE — Patient Instructions (Addendum)
Take tylenol 650 mg every 8 hours as needed.  Take naproxen 440 mg twice a day.       For same day urgent needs: please be aware during business hours Monday through Friday from 8am to 5pm we encourage you to contact us at [phone]   to schedule an appointment. If we are unable to see you, you may also have access to an advice nurse 24 hours a day and urgent care services through   your insurance provider. If you have an HMO we will work with the urgent care center you go to for PCP authorization and if you have any other kind of insurance   you can go straight to an urgent care center without PCP authorization. Please call or go on-line with your insurance provider and ask about their advice nurse services   and to get a list of authorized Urgent Care centers.     After business hours, evenings, and weekends you may call our clinic [phone] and the hospital operator will contact our department's on call doctor to   speak with you. You can also use your insurance provider advice nurse services or go to one of your insurance providers authorized urgent care centers.     Call 911 or go to the nearest Emergency room for life threatening emergencies.      Your x-ray has been ordered.Please go to [id number] for your X-Ray.  Thank you.

## 2021-08-06 NOTE — Nursing Note (Signed)
Reviewed all orders with the patient including lab work, tests, referrals and/or medications ordered.   Per provider, follow up: Needed  Appointment: Scheduledfollow up and nurse visit  Advance Care Planning: ACP not age appropriate   AVS: via MyChart  Spent 6 minutes discharging patient.     Patient Mask Status: Patient was NOT wearing a surgical mask   Employee PPE Used: None     Tito Dine, MA II  [facility]

## 2021-08-06 NOTE — Progress Notes (Signed)
This patient's case was reviewed and discussed with the resident immediately after the patient visit.  I agree with the resident's assessment and plan that we developed, as outlined in the note.  Report electronically signed by:     Priyanka Causey, MD  Associate Physician  [place] [facility] of [facility]  PI #[id number]

## 2021-08-06 NOTE — Nursing Note (Signed)
Vital signs taken, allergies verified, screened for pain. Chief complaint noted,tobacco screening taken/verified. Mobility screened for patients over the age of 18. preferred pharmacy reviewed. Medication reconciliation initiated with printed list of current medications to be reviewed and edited by the patient.   Jasmond River,Medical Assistant  Family Medicine  [phone] [address], [place]        PPE used by staff during encounter: Surgical mask

## 2021-08-07 ENCOUNTER — Encounter: Payer: Self-pay | Admitting: FAMILY PRACTICE

## 2021-08-12 ENCOUNTER — Ambulatory Visit: Payer: Commercial Managed Care - HMO | Attending: Family Medicine

## 2021-08-12 DIAGNOSIS — I10 Essential (primary) hypertension: Secondary | ICD-10-CM | POA: Insufficient documentation

## 2021-08-12 DIAGNOSIS — Z0131 Encounter for examination of blood pressure with abnormal findings: Secondary | ICD-10-CM | POA: Insufficient documentation

## 2021-08-12 NOTE — Progress Notes (Signed)
Kenneth Bauer is a 56yr old male who presents in clinic today for a nurse visit Blood Pressure Check.    Blood pressure machine arrived yesterday, 7/6, and has figured out how to use machine. Stated bottom number dropped down to 132. Doesn't remember what the top number is.     **Feels "wonky", slightly dizzy within the first couple hours of taking medication, slight tightness in chest but stated that is ongoing, otherwise no other complaints regarding side effects at this time.     Blood pressure is 132/92, with pulse of 103 on the left arm. Blood pressure is 151/98, with pulse of 103 on the right arm. Patient wanted to have it rechecked on right arm again after doing meditative breathing. Reading was updated to: 150/101    Verified with patient current pressure medication(s) and dose. Patient has no questions about medications. Results documented and MD notified.     Spoke with Dr. Lenard Lance regarding patients blood pressure, the plan is as follows : Spoke to patient about changing to a combo med, but patient wants to wait a week and come back for nurse visit to check blood pressure again before changing meds. Informed patient that he should be taking his blood pressure every morning and night, before he takes his meds and before he goes to bed, and to keep a log and bring it into the next visit. Patient agreed to this plan.    Patient had questions regarding the xray of his foot, and the plan of care. Spoke with Dr. Lenard Lance and he said that it will be discussed at his next visit.    Jarome Matin, LVN

## 2021-08-20 ENCOUNTER — Ambulatory Visit: Payer: Commercial Managed Care - HMO

## 2021-08-20 ENCOUNTER — Ambulatory Visit: Payer: Commercial Managed Care - HMO | Attending: Family Medicine

## 2021-08-20 DIAGNOSIS — I10 Essential (primary) hypertension: Secondary | ICD-10-CM | POA: Insufficient documentation

## 2021-08-20 DIAGNOSIS — Z0131 Encounter for examination of blood pressure with abnormal findings: Secondary | ICD-10-CM | POA: Insufficient documentation

## 2021-08-20 NOTE — Nursing Note (Signed)
Two patient identifiers used. Nurse visit for blood pressure check. Blood pressure is 145/91, with pulse of 105.   Verified with patient current blood pressure medication(s) and dose. Patient has no questions regarding medication(s).   Patient c/o of chest pain and tightness with stress. Patient denies c/o headache, dizziness, chest pain, blurred vision, and rapid heartbeat. Results documented and MD notified.     Spoke with Dr.Vitrikas regarding patient blood pressure and the plan is as follows: Follow up

## 2021-08-25 ENCOUNTER — Ambulatory Visit: Payer: Commercial Managed Care - HMO | Attending: Family Medicine | Admitting: FAMILY PRACTICE

## 2021-08-25 ENCOUNTER — Encounter: Payer: Self-pay | Admitting: FAMILY PRACTICE

## 2021-08-25 VITALS — BP 155/94 | HR 95 | Temp 98.0°F | Resp 14 | Ht 70.0 in | Wt 256.0 lb

## 2021-08-25 DIAGNOSIS — Z79899 Other long term (current) drug therapy: Secondary | ICD-10-CM | POA: Insufficient documentation

## 2021-08-25 DIAGNOSIS — I10 Essential (primary) hypertension: Secondary | ICD-10-CM | POA: Insufficient documentation

## 2021-08-25 MED ORDER — AMLODIPINE 5 MG TABLET
10.0000 mg | ORAL_TABLET | Freq: Every day | ORAL | 0 refills | Status: DC
Start: 2021-08-25 — End: 2021-10-25

## 2021-08-25 NOTE — Progress Notes (Signed)
Family Medicine Clinic Note    If you are reviewing this progress note and have questions about the meaning or medical terms being used, please schedule an appointment or bring it up at your next follow-up appointment.  Medical notes are a communication tool between medical professionals and require medical terms to be used for efficiency. You can also look up terms via on-line medical dictionaries such as Medline Plus at Mus[employer]Treatments.it.html        Note: Assessment/plan have been moved to the top of the note for convenience.       ASSESSMENT & PLAN  Diagnoses and all orders for this visit:  Primary hypertension  BP elevated today 155/94, on repeat.  Home BP log shows pressures in the systolic 130s.  Given consistent elevation in most recent 3 visits, will increase Norvasc to 10 mg daily.  He seems to be tolerating the medication well without any side effects.  Discussed lifestyle changes in terms of diet and regular exercise.    -     Increase from 5 mg to 10 mg of Amlodipine (NORVASC) 5 mg Tablet; Take 2 tablets by mouth every day.  -      Continue home BP 2-3 times per week, keep log for next visit  -      Follow-up in 3 months  -      Consider dietary referral if still uncontrolled      Modified Medications    Modified Medication Previous Medication    AMLODIPINE (NORVASC) 5 MG TABLET Amlodipine (NORVASC) 5 mg Tablet       Take 2 tablets by mouth every day.    Take 1 tablet by mouth every day.          SUBJECTIVE  Kenneth Bauer is a 58yr male. He had concerns including Medication Follow Up.    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 His skin temperature is 36.7 C (98 F). His blood pressure is 155/94 (abnormal) and his pulse is 95. His respiration is 14 and oxygen saturation is 97%.     Physical Exam  Vitals reviewed.   Constitutional:       Appearance: Normal appearance.   Cardiovascular:      Rate and Rhythm: Normal rate and regular rhythm.      Pulses: Normal pulses.      Heart sounds: No murmur heard.  Pulmonary:      Effort: Pulmonary effort is normal.      Breath sounds: Normal breath sounds.   Abdominal:      Comments: Obese     Musculoskeletal:  General: No swelling.      Right lower leg: No edema.      Left lower leg: No edema.   Skin:     General: Skin is warm and dry.   Neurological:      Mental Status: He is alert and oriented to person, place, and time.   Psychiatric:         Mood and Affect: Mood normal.         Behavior: Behavior normal.           Total time I spent in care of this patient today (excluding time spent on other billable services) was 20 minutes.     Norman Herrlich, MD, MBA  Resident Physician   Family Medicine Program - [facility]  Available by [facility] Text

## 2021-08-25 NOTE — Nursing Note (Signed)
Temp: 36.7 C (98 F) (07/19 1539)  Temp src: Skin (07/19 1539)  Pulse: 95 (07/19 1539)  BP: 157/96 (07/19 1539)  Resp: 14 (07/19 1539)  SpO2: 97 % (07/19 1539)  Height: 177.8 cm (5\' 10" ) (07/19 1539)  Weight: 116.1 kg (256 lb) (07/19 1539)  Vital signs taken, chief complaint noted, allergies verified and screened for pain Tobacco use reviewed and preferred pharmacy verified. Medication reconciliation initiated with printed list of current medications to be reviewed and edited by the patient.   Medical Intrepreting: N/A    Patient Mask Status: Patient was NOT wearing a surgical mask   Employee PPE Used: None     02-07-1991, MA  Family Medicine Clinic,  [facility]  [phone]  Recheck patient blood pressure 155/94 Notified Dr [phone] Documented both vitals on flow sheet.     Claris Gladden, MA II  [facility]

## 2021-08-26 NOTE — Progress Notes (Signed)
This patient was seen by the resident.  I reviewed and agree with the resident's assessment and plan as outlined in the resident's note.  Report electronically signed by Tiyon Sanor, MD. Attending

## 2021-09-15 NOTE — Telephone Encounter (Signed)
Advised to call to get triaged.  Attn Staff: Upon return call, please transfer to PCN Triage

## 2021-09-15 NOTE — Telephone Encounter (Signed)
From: Rhea Bleacher  To: Norman Herrlich, MD  Sent: 09/15/2021 7:28 AM PDT  Subject: Medication    My medication was increased from 5mg  to 10mg  daily. I have not seen a change in the BP levels using my home meter and am experiencing swelling in my ankles. I dropped back down to 5mg  a few days ago.

## 2021-09-20 ENCOUNTER — Ambulatory Visit: Payer: Commercial Managed Care - HMO | Admitting: PSYCHIATRY

## 2021-10-25 ENCOUNTER — Other Ambulatory Visit: Payer: Self-pay | Admitting: FAMILY PRACTICE

## 2021-10-25 DIAGNOSIS — I10 Essential (primary) hypertension: Secondary | ICD-10-CM

## 2021-10-25 NOTE — Telephone Encounter (Signed)
[facility] - Refill Team    Refill authorized per P&T Protocol 10/25/2021     Meets Epic Protocol: YES, as determined by Epic (See Protocol Details for specific information)      ====================================================================    Medication name: AMLODIPINE  Labs (if required by protocol): N/A

## 2021-12-09 ENCOUNTER — Ambulatory Visit: Payer: Self-pay | Admitting: PSYCHIATRY

## 2022-05-08 ENCOUNTER — Other Ambulatory Visit: Payer: Self-pay | Admitting: Student in an Organized Health Care Education/Training Program

## 2022-05-08 DIAGNOSIS — I10 Essential (primary) hypertension: Secondary | ICD-10-CM

## 2022-05-10 NOTE — Telephone Encounter (Signed)
Pharmacy Refill Optimization (PRO)    REFILL NOT APPROPRIATE     Patient not followed by [facility].    Refill Request denied as NOT APPROPRIATE per PRO Workflow 05/10/2022
